# Patient Record
Sex: Female | Born: 1955 | Race: White | Hispanic: No | Smoking: Former smoker
Health system: Southern US, Community
[De-identification: ages and names within clinical notes are randomized; demographics above are authoritative.]

## PROBLEM LIST (undated history)

## (undated) DIAGNOSIS — I10 Essential (primary) hypertension: Secondary | ICD-10-CM

## (undated) HISTORY — PX: HEMORROIDECTOMY: SUR656

## (undated) HISTORY — PX: BLADDER SUSPENSION: SHX72

## (undated) HISTORY — PX: CARPAL TUNNEL RELEASE: SHX101

## (undated) HISTORY — PX: ABDOMINAL HYSTERECTOMY: SHX81

## (undated) HISTORY — PX: COLONOSCOPY: SHX174

## (undated) HISTORY — PX: TUBAL LIGATION: SHX77

---

## 2004-11-29 ENCOUNTER — Ambulatory Visit: Payer: Self-pay | Admitting: Psychiatry

## 2004-11-29 ENCOUNTER — Inpatient Hospital Stay (HOSPITAL_COMMUNITY): Admission: EM | Admit: 2004-11-29 | Discharge: 2004-11-30 | Payer: Self-pay | Admitting: Psychiatry

## 2006-04-04 ENCOUNTER — Ambulatory Visit: Payer: Self-pay | Admitting: Cardiology

## 2014-08-08 ENCOUNTER — Encounter: Payer: Self-pay | Admitting: Internal Medicine

## 2014-08-08 NOTE — Telephone Encounter (Signed)
This encounter was created in error - please disregard.

## 2017-02-16 ENCOUNTER — Encounter (HOSPITAL_COMMUNITY): Payer: Self-pay | Admitting: Emergency Medicine

## 2017-02-16 ENCOUNTER — Emergency Department (HOSPITAL_COMMUNITY)
Admission: EM | Admit: 2017-02-16 | Discharge: 2017-02-16 | Disposition: A | Discharge status: Home / Self Care | Payer: Self-pay | Attending: Emergency Medicine | Admitting: Emergency Medicine

## 2017-02-16 DIAGNOSIS — F17210 Nicotine dependence, cigarettes, uncomplicated: Secondary | ICD-10-CM | POA: Insufficient documentation

## 2017-02-16 DIAGNOSIS — K047 Periapical abscess without sinus: Secondary | ICD-10-CM | POA: Insufficient documentation

## 2017-02-16 DIAGNOSIS — I10 Essential (primary) hypertension: Secondary | ICD-10-CM | POA: Insufficient documentation

## 2017-02-16 DIAGNOSIS — S025XXB Fracture of tooth (traumatic), initial encounter for open fracture: Secondary | ICD-10-CM

## 2017-02-16 DIAGNOSIS — K0381 Cracked tooth: Secondary | ICD-10-CM | POA: Insufficient documentation

## 2017-02-16 HISTORY — DX: Essential (primary) hypertension: I10

## 2017-02-16 MED ORDER — CLINDAMYCIN HCL 300 MG PO CAPS: 300.0000 mg | ORAL_CAPSULE | Freq: Four times a day (QID) | ORAL | 0 refills | Status: DC

## 2017-02-16 MED ORDER — HYDROCODONE-ACETAMINOPHEN 5-325 MG PO TABS: 1.0000 | ORAL_TABLET | ORAL | 0 refills | Status: DC | PRN

## 2017-02-16 MED ORDER — CLINDAMYCIN HCL 150 MG PO CAPS
300.0000 mg | ORAL_CAPSULE | Freq: Once | ORAL | Status: AC
  Administered 2017-02-16: 300 mg via ORAL
  Filled 2017-02-16: qty 2

## 2017-02-16 MED ORDER — HYDROCODONE-ACETAMINOPHEN 5-325 MG PO TABS
1.0000 | ORAL_TABLET | Freq: Once | ORAL | Status: AC
  Administered 2017-02-16: 1 via ORAL
  Filled 2017-02-16: qty 1

## 2017-02-16 NOTE — ED Triage Notes (Signed)
Patient c/o right upper dental pain that started yesterday. Patient states broke tooth 2 weeks ago. Denies any fevers but reports facial swelling. Patient taking ibuprofen, goody powder, and using clove oil with no relief.

## 2017-02-16 NOTE — ED Notes (Signed)
Dental pain x 2 weeks after her tooth broke now pain and swelling not relieved by OTC meds Dentist [facility]

## 2017-02-16 NOTE — ED Provider Notes (Signed)
[name] DEPT Provider Note   CSN: [id number] Arrival date & time: 02/16/17  1140  By signing my name below, I, [name], attest that this documentation has been prepared under the direction and in the presence of Evalee Jefferson, PA-C. Electronically Signed: [name], Scribe. 02/16/17. 1:39 PM.  History   Chief Complaint Chief Complaint  Patient presents with  . Dental Pain   HPI Comments: Kristy Hall is a 61 y.o. female with a hx of hypertension, who presents to the Emergency Department complaining of  Gradual onset, constant right upper dental pain that began yesterday. Patient states she bit into something and her tooth broke off 2 weeks ago. Patient reports associated facial swelling. Patient reports taking aleve, goody powder, and using clove oil with no relief in pain. Patient describes her pain as throbbing with a severity of 10/10. Patient states the pain is worse with cold sensation, better with warm compresses. Patient does have a primary care dentist (Dr. Nori Riis). Patient denies any medication allergies. Patient denies any fever, headache, or any other symptoms.   The history is provided by the patient. No language interpreter was used.    Past Medical History:  Diagnosis Date  . Hypertension     There are no active problems to display for this patient.   Past Surgical History:  Procedure Laterality Date  . ABDOMINAL HYSTERECTOMY    . BLADDER SUSPENSION    . CARPAL TUNNEL RELEASE Bilateral   . HEMORROIDECTOMY      OB History    Gravida Para Term Preterm AB Living   3 3 3     3    SAB TAB Ectopic Multiple Live Births                   Home Medications    Prior to Admission medications   Medication Sig Start Date End Date Taking? Authorizing Provider  clindamycin (CLEOCIN) 300 MG capsule Take 1 capsule (300 mg total) by mouth every 6 (six) hours. 02/16/17   Evalee Jefferson, PA-C  HYDROcodone-acetaminophen (NORCO/VICODIN) 5-325 MG tablet Take 1 tablet by  mouth every 4 (four) hours as needed. 02/16/17   Evalee Jefferson, PA-C    Family History History reviewed. No pertinent family history.  Social History Social History  Substance Use Topics  . Smoking status: Current Every Day Smoker    Packs/day: 1.00    Years: 15.00    Types: Cigarettes  . Smokeless tobacco: Never Used  . Alcohol use Yes     Comment: occasional     Allergies   Patient has no known allergies.   Review of Systems Review of Systems  Constitutional: Negative for fever.  HENT: Positive for dental problem and facial swelling. Negative for sore throat.   Respiratory: Negative for shortness of breath.   Musculoskeletal: Negative for neck pain and neck stiffness.  Neurological: Negative for headaches.     Physical Exam Updated Vital Signs BP 146/84   Pulse 78   Temp 98.2 F (36.8 C) (Oral)   Resp 18   Ht 5\' 1"  (1.549 m)   Wt 61.2 kg   SpO2 98%   BMI 25.51 kg/m   Physical Exam  Constitutional: She is oriented to person, place, and time. She appears well-developed and well-nourished. No distress.  HENT:  Head: Normocephalic and atraumatic.  Right Ear: Tympanic membrane and external ear normal.  Left Ear: Tympanic membrane and external ear normal.  Mouth/Throat: Oropharynx is clear and moist and mucous membranes are normal.  No oral lesions. No trismus in the jaw. Dental abscesses present.  Right upper lateral incisor is fractured to the gingiva line with indurated buccal mucosa. There is a mild early induration to her right nasal fold without facial erythema. No drainage. No fluctuance.  Bilateral TMs are clear. External canals are patent. Sublingual space is soft.   Eyes: Conjunctivae and EOM are normal. Pupils are equal, round, and reactive to light.  Neck: Normal range of motion. Neck supple.  Cardiovascular: Normal rate and normal heart sounds.   Pulmonary/Chest: Effort normal.  Abdominal: She exhibits no distension.  Musculoskeletal: Normal range of  motion.  Lymphadenopathy:    She has no cervical adenopathy.  Neurological: She is alert and oriented to person, place, and time.  Skin: Skin is warm and dry. No erythema.  Psychiatric: She has a normal mood and affect.     ED Treatments / Results  DIAGNOSTIC STUDIES: Oxygen Saturation is 98% on RA, normal by my interpretation.    COORDINATION OF CARE: 1:39 PM Discussed treatment plan with pt at bedside and pt agreed to plan, which includes pain medication and antibiotics.   Labs (all labs ordered are listed, but only abnormal results are displayed) Labs Reviewed - No data to display  EKG  EKG Interpretation None       Radiology No results found.  Procedures Procedures (including critical care time)  Medications Ordered in ED Medications  clindamycin (CLEOCIN) capsule 300 mg (300 mg Oral Given 02/16/17 1402)  HYDROcodone-acetaminophen (NORCO/VICODIN) 5-325 MG per tablet 1 tablet (1 tablet Oral Given 02/16/17 1402)     Initial Impression / Assessment and Plan / ED Course  I have reviewed the triage vital signs and the nursing notes.  Pertinent labs & imaging results that were available during my care of the patient were reviewed by me and considered in my medical decision making (see chart for details).     Localized dental abscess with dental fracture.  Clindamycin given early facial involvement, hydrocodone.  Pt to contact dentist in am for definitive tx.  Advised returning here for any expanding facial pain or swelling.  Final Clinical Impressions(s) / ED Diagnoses   Final diagnoses:  Dental infection  Open fracture of tooth, initial encounter    New Prescriptions Discharge Medication List as of 02/16/2017  2:01 PM    START taking these medications   Details  clindamycin (CLEOCIN) 300 MG capsule Take 1 capsule (300 mg total) by mouth every 6 (six) hours., Starting Sun 02/16/2017, Print    HYDROcodone-acetaminophen (NORCO/VICODIN) 5-325 MG tablet Take 1  tablet by mouth every 4 (four) hours as needed., Starting Sun 02/16/2017, Print       I personally performed the services described in this documentation, which was scribed in my presence. The recorded information has been reviewed and is accurate.     Evalee Jefferson, PA-C 02/17/17 1303    Milton Ferguson, MD 02/17/17 [phone]

## 2017-02-16 NOTE — Discharge Instructions (Signed)
Complete your entire course of antibiotics as prescribed.  You  may use the hydrocodone for pain relief but do not drive within 4 hours of taking as this will make you drowsy.  Avoid applying heat or ice to this abscess area which can worsen your symptoms.  You may use warm salt water swish and spit treatment or half peroxide and water swish and spit after meals to keep this area clean as discussed.  Call your dentist Monday to obtain follow up care.

## 2018-04-24 ENCOUNTER — Other Ambulatory Visit: Payer: Self-pay | Admitting: Internal Medicine

## 2018-04-24 DIAGNOSIS — M545 Low back pain: Secondary | ICD-10-CM

## 2019-11-05 ENCOUNTER — Other Ambulatory Visit: Payer: Self-pay

## 2019-11-05 ENCOUNTER — Encounter (HOSPITAL_COMMUNITY): Payer: Self-pay | Admitting: Cardiology

## 2019-11-05 ENCOUNTER — Inpatient Hospital Stay (HOSPITAL_COMMUNITY)
Admission: RE | Admit: 2019-11-05 | Discharge: 2019-11-06 | DRG: 247 | Disposition: A | Discharge status: Home / Self Care | Payer: Medicare Other | Source: Other Acute Inpatient Hospital | Attending: Cardiology | Admitting: Cardiology

## 2019-11-05 ENCOUNTER — Encounter (HOSPITAL_COMMUNITY): Admission: RE | Disposition: A | Discharge status: Home / Self Care | Payer: Self-pay | Attending: Cardiology

## 2019-11-05 DIAGNOSIS — I200 Unstable angina: Secondary | ICD-10-CM | POA: Diagnosis present

## 2019-11-05 DIAGNOSIS — I2510 Atherosclerotic heart disease of native coronary artery without angina pectoris: Secondary | ICD-10-CM | POA: Diagnosis present

## 2019-11-05 DIAGNOSIS — I214 Non-ST elevation (NSTEMI) myocardial infarction: Principal | ICD-10-CM | POA: Diagnosis present

## 2019-11-05 DIAGNOSIS — F17200 Nicotine dependence, unspecified, uncomplicated: Secondary | ICD-10-CM | POA: Diagnosis not present

## 2019-11-05 DIAGNOSIS — I10 Essential (primary) hypertension: Secondary | ICD-10-CM | POA: Diagnosis present

## 2019-11-05 DIAGNOSIS — I25110 Atherosclerotic heart disease of native coronary artery with unstable angina pectoris: Secondary | ICD-10-CM | POA: Diagnosis not present

## 2019-11-05 DIAGNOSIS — F17210 Nicotine dependence, cigarettes, uncomplicated: Secondary | ICD-10-CM | POA: Diagnosis present

## 2019-11-05 DIAGNOSIS — E782 Mixed hyperlipidemia: Secondary | ICD-10-CM | POA: Diagnosis present

## 2019-11-05 DIAGNOSIS — Z955 Presence of coronary angioplasty implant and graft: Secondary | ICD-10-CM

## 2019-11-05 HISTORY — DX: Mixed hyperlipidemia: E78.2

## 2019-11-05 HISTORY — DX: Nicotine dependence, unspecified, uncomplicated: F17.200

## 2019-11-05 HISTORY — PX: LEFT HEART CATH AND CORONARY ANGIOGRAPHY: CATH118249

## 2019-11-05 HISTORY — PX: CORONARY STENT INTERVENTION: CATH118234

## 2019-11-05 LAB — POCT ACTIVATED CLOTTING TIME: Activated Clotting Time: 290 seconds

## 2019-11-05 SURGERY — LEFT HEART CATH AND CORONARY ANGIOGRAPHY
Anesthesia: LOCAL

## 2019-11-05 MED ORDER — HEPARIN (PORCINE) IN NACL 1000-0.9 UT/500ML-% IV SOLN
INTRAVENOUS | Status: AC
  Filled 2019-11-05: qty 1000

## 2019-11-05 MED ORDER — SODIUM CHLORIDE 0.9 % IV SOLN: 250.0000 mL | INTRAVENOUS | Status: DC | PRN

## 2019-11-05 MED ORDER — SODIUM CHLORIDE 0.9% FLUSH
3.0000 mL | Freq: Two times a day (BID) | INTRAVENOUS | Status: DC
  Administered 2019-11-05: 3 mL via INTRAVENOUS

## 2019-11-05 MED ORDER — SODIUM CHLORIDE 0.9 % IV SOLN
250.0000 mL | INTRAVENOUS | Status: DC | PRN
  Administered 2019-11-05: 500 mL via INTRAVENOUS

## 2019-11-05 MED ORDER — VERAPAMIL HCL 2.5 MG/ML IV SOLN
INTRAVENOUS | Status: DC | PRN
  Administered 2019-11-05: 4 mL via INTRA_ARTERIAL

## 2019-11-05 MED ORDER — SODIUM CHLORIDE 0.9% FLUSH: 3.0000 mL | INTRAVENOUS | Status: DC | PRN

## 2019-11-05 MED ORDER — ASPIRIN EC 81 MG PO TBEC
81.0000 mg | DELAYED_RELEASE_TABLET | Freq: Every day | ORAL | Status: DC
  Administered 2019-11-06: 81 mg via ORAL
  Filled 2019-11-05: qty 1

## 2019-11-05 MED ORDER — NITROGLYCERIN 0.4 MG SL SUBL: 0.4000 mg | SUBLINGUAL_TABLET | SUBLINGUAL | Status: DC | PRN

## 2019-11-05 MED ORDER — MIDAZOLAM HCL 2 MG/2ML IJ SOLN
INTRAMUSCULAR | Status: DC | PRN
  Administered 2019-11-05: 2 mg via INTRAVENOUS

## 2019-11-05 MED ORDER — ACETAMINOPHEN 325 MG PO TABS: 650.0000 mg | ORAL_TABLET | ORAL | Status: DC | PRN

## 2019-11-05 MED ORDER — MIDAZOLAM HCL 2 MG/2ML IJ SOLN
INTRAMUSCULAR | Status: AC
  Filled 2019-11-05: qty 2

## 2019-11-05 MED ORDER — ATORVASTATIN CALCIUM 80 MG PO TABS: 80.0000 mg | ORAL_TABLET | Freq: Every day | ORAL | Status: DC

## 2019-11-05 MED ORDER — METOPROLOL TARTRATE 12.5 MG HALF TABLET
12.5000 mg | ORAL_TABLET | Freq: Two times a day (BID) | ORAL | Status: DC
  Administered 2019-11-05 – 2019-11-06 (×2): 12.5 mg via ORAL
  Filled 2019-11-05 (×2): qty 1

## 2019-11-05 MED ORDER — TICAGRELOR 90 MG PO TABS
90.0000 mg | ORAL_TABLET | Freq: Two times a day (BID) | ORAL | Status: DC
  Administered 2019-11-06: 04:00:00 90 mg via ORAL
  Filled 2019-11-05: qty 1

## 2019-11-05 MED ORDER — SODIUM CHLORIDE 0.9 % WEIGHT BASED INFUSION: 1.0000 mL/kg/h | INTRAVENOUS | Status: DC

## 2019-11-05 MED ORDER — LIDOCAINE HCL (PF) 1 % IJ SOLN
INTRAMUSCULAR | Status: AC
  Filled 2019-11-05: qty 30

## 2019-11-05 MED ORDER — SODIUM CHLORIDE 0.9 % WEIGHT BASED INFUSION: 3.0000 mL/kg/h | INTRAVENOUS | Status: DC

## 2019-11-05 MED ORDER — FENTANYL CITRATE (PF) 100 MCG/2ML IJ SOLN
INTRAMUSCULAR | Status: DC | PRN
  Administered 2019-11-05: 25 ug via INTRAVENOUS

## 2019-11-05 MED ORDER — NITROGLYCERIN 1 MG/10 ML FOR IR/CATH LAB
INTRA_ARTERIAL | Status: AC
  Filled 2019-11-05: qty 10

## 2019-11-05 MED ORDER — FENTANYL CITRATE (PF) 100 MCG/2ML IJ SOLN
INTRAMUSCULAR | Status: AC
  Filled 2019-11-05: qty 2

## 2019-11-05 MED ORDER — TICAGRELOR 90 MG PO TABS
ORAL_TABLET | ORAL | Status: AC
  Filled 2019-11-05: qty 2

## 2019-11-05 MED ORDER — HEPARIN SODIUM (PORCINE) 1000 UNIT/ML IJ SOLN
INTRAMUSCULAR | Status: AC
  Filled 2019-11-05: qty 1

## 2019-11-05 MED ORDER — LIDOCAINE HCL (PF) 1 % IJ SOLN
INTRAMUSCULAR | Status: DC | PRN
  Administered 2019-11-05: 2 mL via SUBCUTANEOUS

## 2019-11-05 MED ORDER — TICAGRELOR 90 MG PO TABS
ORAL_TABLET | ORAL | Status: DC | PRN
  Administered 2019-11-05: 180 mg via ORAL

## 2019-11-05 MED ORDER — ASPIRIN 81 MG PO CHEW: 81.0000 mg | CHEWABLE_TABLET | ORAL | Status: DC

## 2019-11-05 MED ORDER — ONDANSETRON HCL 4 MG/2ML IJ SOLN: 4.0000 mg | Freq: Four times a day (QID) | INTRAMUSCULAR | Status: DC | PRN

## 2019-11-05 MED ORDER — SODIUM CHLORIDE 0.9% FLUSH
3.0000 mL | Freq: Two times a day (BID) | INTRAVENOUS | Status: DC
  Administered 2019-11-06: 3 mL via INTRAVENOUS

## 2019-11-05 MED ORDER — VERAPAMIL HCL 2.5 MG/ML IV SOLN
INTRAVENOUS | Status: AC
  Filled 2019-11-05: qty 2

## 2019-11-05 MED ORDER — SODIUM CHLORIDE 0.9 % WEIGHT BASED INFUSION
1.0000 mL/kg/h | INTRAVENOUS | Status: AC
  Administered 2019-11-05: 1 mL/kg/h via INTRAVENOUS

## 2019-11-05 MED ORDER — HEPARIN SODIUM (PORCINE) 1000 UNIT/ML IJ SOLN
INTRAMUSCULAR | Status: DC | PRN
  Administered 2019-11-05: 4000 [IU] via INTRAVENOUS
  Administered 2019-11-05: 3000 [IU] via INTRAVENOUS

## 2019-11-05 MED ORDER — HEPARIN (PORCINE) IN NACL 1000-0.9 UT/500ML-% IV SOLN
INTRAVENOUS | Status: DC | PRN
  Administered 2019-11-05 (×2): 500 mL

## 2019-11-05 MED ORDER — IOHEXOL 350 MG/ML SOLN
INTRAVENOUS | Status: DC | PRN
  Administered 2019-11-05: 100 mL via INTRA_ARTERIAL

## 2019-11-05 MED ORDER — NITROGLYCERIN 1 MG/10 ML FOR IR/CATH LAB
INTRA_ARTERIAL | Status: DC | PRN
  Administered 2019-11-05 (×2): 200 ug via INTRACORONARY

## 2019-11-05 SURGICAL SUPPLY — 15 items
BALLN EMERGE MR 2.5X15 (BALLOONS) ×2
BALLOON EMERGE MR 2.5X15 (BALLOONS) IMPLANT
CATH LAUNCHER 6FR EBU3.5 (CATHETERS) ×1 IMPLANT
CATH OPTITORQUE TIG 4.0 5F (CATHETERS) ×1 IMPLANT
DEVICE RAD COMP TR BAND LRG (VASCULAR PRODUCTS) ×1 IMPLANT
GLIDESHEATH SLEND SS 6F .021 (SHEATH) ×1 IMPLANT
GUIDEWIRE INQWIRE 1.5J.035X260 (WIRE) IMPLANT
INQWIRE 1.5J .035X260CM (WIRE) ×4
KIT ENCORE 26 ADVANTAGE (KITS) ×1 IMPLANT
KIT HEART LEFT (KITS) ×2 IMPLANT
PACK CARDIAC CATHETERIZATION (CUSTOM PROCEDURE TRAY) ×2 IMPLANT
STENT RESOLUTE ONYX 2.5X18 (Permanent Stent) ×1 IMPLANT
TRANSDUCER W/STOPCOCK (MISCELLANEOUS) ×2 IMPLANT
TUBING CIL FLEX 10 FLL-RA (TUBING) ×2 IMPLANT
WIRE COUGAR XT STRL 190CM (WIRE) ×1 IMPLANT

## 2019-11-05 NOTE — H&P (Addendum)
CARDIOLOGY ADMIT NOTE   Kristy Hall is an 63 y.o. female.    CC: Chest pain     HPI: Kristy Hall  is a 63 y.o. Caucasian female with hypertension, tobacco use disorder, has at least a 30-40-pack-year history of smoking, recently also told to have mild hyperlipidemia, not on therapy, admitted to [facility] with chest pain ongoing for the past 3 to 4 days.  Chest pain described as tightness to heaviness in the middle of the chest and also radiating to the right arm, was on and off for the past 4 days.  Eventually pain got worse, presented to the ED, although EKG was normal, with her rest pain and mildly abnormal serum troponin, she was evaluated in the hospital, was recommended cardiac catheterization by cardiologist who was evaluating at [facility] hospital.  Patient did not want to go to [facility], requested that she be transferred to [facility].  States that since being on IV medications, chest pain symptoms are resolved.  She is presently doing well.  Past Medical History:  Diagnosis Date  . Hypertension   . Mixed hyperlipidemia 11/05/2019  . Tobacco use disorder 11/05/2019    Past Surgical History:  Procedure Laterality Date  . ABDOMINAL HYSTERECTOMY    . BLADDER SUSPENSION    . CARPAL TUNNEL RELEASE Bilateral   . HEMORROIDECTOMY      Social History   Socioeconomic History  . Marital status: Married    Spouse name: Not on file  . Number of children: Not on file  . Years of education: Not on file  . Highest education level: Not on file  Occupational History  . Not on file  Social Needs  . Financial resource strain: Not on file  . Food insecurity    Worry: Not on file    Inability: Not on file  . Transportation needs    Medical: Not on file    Non-medical: Not on file  Tobacco Use  . Smoking status: Current Every Day Smoker    Packs/day: 1.00    Years: 15.00    Pack years: 15.00    Types: Cigarettes  . Smokeless tobacco: Never Used   Substance and Sexual Activity  . Alcohol use: Yes    Comment: occasional  . Drug use: No  . Sexual activity: Not on file  Lifestyle  . Physical activity    Days per week: Not on file    Minutes per session: Not on file  . Stress: Not on file  Relationships  . Social Herbalist on phone: Not on file    Gets together: Not on file    Attends religious service: Not on file    Active member of club or organization: Not on file    Attends meetings of clubs or organizations: Not on file    Relationship status: Not on file  . Intimate partner violence    Fear of current or ex partner: Not on file    Emotionally abused: Not on file    Physically abused: Not on file    Forced sexual activity: Not on file  Other Topics Concern  . Not on file  Social History Narrative  . Not on file    No current facility-administered medications on file prior to encounter.    Current Outpatient Medications on File Prior to Encounter  Medication Sig Dispense Refill  . clindamycin (CLEOCIN) 300 MG capsule Take 1 capsule (300 mg total) by mouth  every 6 (six) hours. 28 capsule 0  . HYDROcodone-acetaminophen (NORCO/VICODIN) 5-325 MG tablet Take 1 tablet by mouth every 4 (four) hours as needed. 15 tablet 0    No intake/output data recorded.  No intake/output data recorded.   Review of Systems  Constitution: Negative for chills, decreased appetite, malaise/fatigue and weight gain.  Cardiovascular: Positive for chest pain and dyspnea on exertion. Negative for leg swelling and syncope.  Endocrine: Negative for cold intolerance.  Hematologic/Lymphatic: Does not bruise/bleed easily.  Musculoskeletal: Negative for joint swelling.  Gastrointestinal: Negative for abdominal pain, anorexia, change in bowel habit, hematochezia and melena.  Neurological: Negative for headaches and light-headedness.  Psychiatric/Behavioral: Negative for depression and substance abuse.  All other systems reviewed and  are negative.   Objective:  There were no vitals taken for this visit. There is no height or weight on file to calculate BMI. Vitals with BMI 02/16/2017 02/16/2017  Height - '5\' 1"'   Weight - 135 lbs  BMI - 88.9  Systolic 169 450  Diastolic 84 96  Pulse 78 96    There were no vitals taken for this visit. There is no height or weight on file to calculate BMI.   Physical Exam  Constitutional:  She is moderately built and well-nourished in no acute distress.  HENT:  Head: Atraumatic.  Eyes: Conjunctivae are normal.  Neck: Neck supple. No JVD present. No thyromegaly present.  Cardiovascular: Normal rate, regular rhythm, normal heart sounds and intact distal pulses. Exam reveals no gallop.  No murmur heard. No leg edema, no JVD.  Pulmonary/Chest: Effort normal and breath sounds normal.  Abdominal: Soft. Bowel sounds are normal.  Musculoskeletal: Normal range of motion.  Neurological: She is alert.  Skin: Skin is warm and dry.  Psychiatric: She has a normal mood and affect.   Radiology: No results found.  Laboratory examination:   Labs 11/05/2019: Sodium 140, potassium 3.3, BUN 17, creatinine 0.56, EGFR greater than 60 mL, CMP otherwise normal.  D-dimer negative at 0.41. HPI 12.9/HCT 39.0, platelets 267.  Normal indicis. Serum troponins serial: 0.04, 0.03, 0.06, 0.05, abnormal, normal <0.01.  Total cholesterol 246, triglycerides 199, HDL 54, LDL 152.  No results for input(s): NA, K, CL, CO2, GLUCOSE, BUN, CREATININE, CALCIUM, GFRNONAA, GFRAA in the last 8760 hours. No flowsheet data found. No flowsheet data found. Lipid Panel  No results found for: CHOL, TRIG, HDL, CHOLHDL, VLDL, LDLCALC, LDLDIRECT HEMOGLOBIN A1C No results found for: HGBA1C, MPG TSH No results for input(s): TSH in the last 8760 hours. Medications   Prior to Admission medications   Medication Sig Start Date End Date Taking? Authorizing Provider  clindamycin (CLEOCIN) 300 MG capsule Take 1 capsule (300 mg  total) by mouth every 6 (six) hours. 02/16/17   Evalee Jefferson, PA-C  HYDROcodone-acetaminophen (NORCO/VICODIN) 5-325 MG tablet Take 1 tablet by mouth every 4 (four) hours as needed. 02/16/17   Evalee Jefferson, PA-C     Current Outpatient Medications  Medication Instructions  . clindamycin (CLEOCIN) 300 mg, Oral, Every 6 hours  . HYDROcodone-acetaminophen (NORCO/VICODIN) 5-325 MG tablet 1 tablet, Oral, Every 4 hours PRN    Cardiac Studies:   EKG 11/05/2019: Normal sinus rhythm at rate of 75 bpm, normal axis, no evidence of ischemia, normal EKG.  Chest x-ray PA and lateral view 10/22/2019: Heart and mediastinal contours are within normal limits.  Mildly calcified aortic knob.  Mild linear bibasilar atelectasis.  Assessment  1.  Unstable angina pectoris 2.  Essential hypertension 3.  Mixed hyperlipidemia 4.  Tobacco use disorder  Recommendations:   Patient presenting with ongoing chest pain, which is improved with nitroglycerin and also on IV heparin has not had any recurrence.  Although the troponins are flat, no EKG abnormality, in view of ongoing chest pain and her risk factors including uncontrolled and untreated hyperlipidemia, tobacco use disorder, will proceed with cardiac catheterization.  Patient was already started on Brilinta and aspirin by local cardiologist who did the consult earlier today in [facility].  [facility] also resume antihypertensive medications and also discussed regarding smoking cessation.  Further recommendations to follow.  Patient already has established atherosclerosis as evidenced by calcification of the aortic knob on chest x-ray.  Adrian Prows, MD, [facility] 11/05/2019, 4:49 PM [facility] [place] Pager: [phone] Office: [phone] If no answer Cell [phone]

## 2019-11-05 NOTE — Progress Notes (Signed)
Patient called out at 1900 stating she thought wrist was bleeding. No bleeding noted on assessment, but hematoma above band present. Pressure held; bedside report given to [facility] who will continue to monitor closely.

## 2019-11-05 NOTE — Interval H&P Note (Signed)
History and Physical Interval Note:  11/05/2019 4:56 PM  Kristy Hall  has presented today for surgery, with the diagnosis of Chest pain.  The various methods of treatment have been discussed with the patient and family. After consideration of risks, benefits and other options for treatment, the patient has consented to  Procedure(s): LEFT HEART CATH AND CORONARY ANGIOGRAPHY (N/A) and possible angioplasty as a surgical intervention.  The patient's history has been reviewed, patient examined, no change in status, stable for surgery.  I have reviewed the patient's chart and labs.  Questions were answered to the patient's satisfaction.   Cath Lab Visit (complete for each Cath Lab visit)  Clinical Evaluation Leading to the Procedure:   ACS: Yes.    Non-ACS:    Anginal Classification: CCS IV  Anti-ischemic medical therapy: No Therapy  Non-Invasive Test Results: No non-invasive testing performed  Prior CABG: No previous CABG        Adrian Prows

## 2019-11-06 ENCOUNTER — Encounter (HOSPITAL_COMMUNITY): Payer: Self-pay | Admitting: *Deleted

## 2019-11-06 ENCOUNTER — Other Ambulatory Visit: Payer: Self-pay

## 2019-11-06 LAB — CBC
HCT: 37.8 % (ref 36.0–46.0)
Hemoglobin: 12.6 g/dL (ref 12.0–15.0)
MCH: 29.8 pg (ref 26.0–34.0)
MCHC: 33.3 g/dL (ref 30.0–36.0)
MCV: 89.4 fL (ref 80.0–100.0)
Platelets: 230 10*3/uL (ref 150–400)
RBC: 4.23 MIL/uL (ref 3.87–5.11)
RDW: 13.1 % (ref 11.5–15.5)
WBC: 10.2 10*3/uL (ref 4.0–10.5)
nRBC: 0 % (ref 0.0–0.2)

## 2019-11-06 LAB — BASIC METABOLIC PANEL
Anion gap: 10 (ref 5–15)
BUN: 12 mg/dL (ref 8–23)
CO2: 23 mmol/L (ref 22–32)
Calcium: 9.1 mg/dL (ref 8.9–10.3)
Chloride: 107 mmol/L (ref 98–111)
Creatinine, Ser: 0.79 mg/dL (ref 0.44–1.00)
GFR calc Af Amer: >60 mL/min (ref >60)
GFR calc non Af Amer: >60 mL/min (ref >60)
Glucose, Bld: 94 mg/dL (ref 70–99)
Potassium: 3.8 mmol/L (ref 3.5–5.1)
Sodium: 140 mmol/L (ref 135–145)

## 2019-11-06 LAB — HIV ANTIBODY (ROUTINE TESTING W REFLEX): HIV Screen 4th Generation wRfx: NONREACTIVE

## 2019-11-06 MED ORDER — ANGIOPLASTY BOOK
Freq: Once | Status: AC
  Administered 2019-11-06: 04:00:00
  Filled 2019-11-06: qty 1

## 2019-11-06 MED ORDER — ASPIRIN 81 MG PO TBEC: 81.0000 mg | DELAYED_RELEASE_TABLET | Freq: Every day | ORAL | 2 refills | Status: DC

## 2019-11-06 MED ORDER — ACETAMINOPHEN 325 MG PO TABS: 650.0000 mg | ORAL_TABLET | ORAL | Status: DC | PRN

## 2019-11-06 MED ORDER — METOPROLOL SUCCINATE ER 25 MG PO TB24: 25.0000 mg | ORAL_TABLET | Freq: Every day | ORAL | Status: DC

## 2019-11-06 MED ORDER — METOPROLOL SUCCINATE ER 25 MG PO TB24: 25.0000 mg | ORAL_TABLET | Freq: Every day | ORAL | 1 refills | Status: DC

## 2019-11-06 MED ORDER — TICAGRELOR 90 MG PO TABS: 90.0000 mg | ORAL_TABLET | Freq: Two times a day (BID) | ORAL | 11 refills | Status: DC

## 2019-11-06 MED ORDER — NICOTINE 14 MG/24HR TD PT24: 14.0000 mg | MEDICATED_PATCH | TRANSDERMAL | 0 refills | Status: DC

## 2019-11-06 MED ORDER — ATORVASTATIN CALCIUM 80 MG PO TABS: 80.0000 mg | ORAL_TABLET | Freq: Every day | ORAL | 1 refills | Status: DC

## 2019-11-06 MED ORDER — NITROGLYCERIN 0.4 MG SL SUBL: 0.4000 mg | SUBLINGUAL_TABLET | SUBLINGUAL | 3 refills | Status: DC | PRN

## 2019-11-06 NOTE — Progress Notes (Signed)
Nurse provided pt with a one month free card for Brilinta. [place] at [phone], spoke with [facility] and they will accept the Brilinta card. Met with pt and she is concern and that she won't be able to afford her co-payment of $400 for the brilinta. Informed pt that Suzie Portela is going to accept the Brilinta one month free card and to f/u with her physician and discuss her concerns about her co-payment. Discussed with pt the importance of taking the medication and informing her doctor about her inability to afford this medication. Discussed with RN pt's concern.

## 2019-11-06 NOTE — Progress Notes (Signed)
CARDIAC REHAB PHASE I   PRE:  Rate/Rhythm: 77 SR    BP: sitting 142/72    SaO2:   MODE:  Ambulation: 400 ft   POST:  Rate/Rhythm: 89 SR    BP: sitting 139/76     SaO2:    tolerated well, feels good. Ed completed including Brilinta, stent, restrictions, smoking cessation, diet, exercise, NTG and CRPII. Will refer to [place]. Pt is motivated to quit smoking. She understands Brilinta importance. [place], ACSM 11/06/2019 9:54 AM

## 2019-11-06 NOTE — Progress Notes (Signed)
TR BAND REMOVAL  LOCATION:    right radial  DEFLATED PER PROTOCOL:    Yes.    TIME BAND OFF / DRESSING APPLIED:    2150   SITE UPON ARRIVAL:    Level 1(small hematoma resolved)  SITE AFTER BAND REMOVAL:    Level 1(bruise)  CIRCULATION SENSATION AND MOVEMENT:    Within Normal Limits   Yes.    COMMENTS:   Pt/tolerated well.No bleeding noted ,with good capillary refill.

## 2019-11-06 NOTE — Discharge Summary (Signed)
Physician Discharge Summary  Patient ID: Kristy Hall MRN: [id number] DOB/AGE: Mar 28, 1956 63 y.o.  Admit date: 11/05/2019 Discharge date: 11/06/2019  Primary Discharge Diagnosis NSTEMI CAD with unstable angina  Secondary Discharge Diagnosis Hypertension Mixed hyperlipidemia Tobacco use disorder  Significant Diagnostic Studies: Left heart catheterization 11/05/2019: LV: Normal LV systolic function without wall motion abnormality.  Normal LVEDP. RCA: Dominant.  Proximal 40% stenosis.  Mid to distal 20% stenosis. Left main: Normal. LAD: Large caliber vessel, diffusely diseased mild to moderately.  No high-grade stenosis evident.  Gives origin to a moderate sized D1 and several small D s. Ramus intermediate: Small sized vessel, tortuous, mild diffuse disease. Circumflex: Large area of distribution, gives origin to moderate sized OM1, proximal circumflex has a 95% ulcerated stenosis.  SP 2.5 x 18 mm resolute Onyx DES reduced to 0% with TIMI-3 to TIMI-3 flow.  EKG 11/06/2019: Normal sinus rhythm with rate of 62 bpm, normal axis.  No evidence of ischemia, normal EKG.  Recommendations: Patient be discharged home if she remains stable tomorrow otherwise in 48 hours.  She will need dual antiplatelet therapy for a year.  High-dose statins will be utilized.  Smoking cessation   Hospital Course: Transferred from [facility] with non-STEMI has diagnosis with elevated serum troponin and mildly abnormal EKG with ongoing chest pain.  Underwent urgent cardiac catheterization same day as hospital admission and was found to have high-grade single-vessel disease and moderate disease in other vessels, underwent successful angioplasty.  She was admitted for further observation, however the following morning she was asymptomatic and walking and ambulating in the hallway without any chest pain or shortness of breath.  Telemetry did not reveal any significant arrhythmias.  Felt stable for  discharge.  Recommendations on discharge: Patient will continue dual antiplatelet therapy with aspirin and Brilinta for at least 1 year.  High-dose statin with Lipitor 80 mg have been started which she is tolerating so far.  I have discontinued amlodipine and switch her to metoprolol succinate 25 mg daily.  I will see her back in the office in 1 week to 10 days.  Smoking cessation was discussed with the patient at length, she appears very motivated.  I have also sent in for nicotine patches.  Discharge Exam: Blood pressure 134/76, pulse 62, temperature 98.2 F (36.8 C), temperature source Oral, resp. rate 16, height 5' (1.524 m), weight 60.8 kg, SpO2 97 %.   Physical Exam  Constitutional:  She is moderately built and well-nourished in no acute distress.  Appears older than stated age.  HENT:  Head: Atraumatic.  Eyes: Conjunctivae are normal.  Neck: Neck supple. No JVD present. No thyromegaly present.  Cardiovascular: Normal rate, regular rhythm, normal heart sounds, intact distal pulses and normal pulses. Exam reveals no gallop.  No murmur heard. No leg edema, no JVD. Right radial site without any complications.  Pulmonary/Chest: Effort normal and breath sounds normal.  Abdominal: Soft. Bowel sounds are normal.  Musculoskeletal: Normal range of motion.  Neurological: She is alert.  Skin: Skin is warm and dry.  Psychiatric: She has a normal mood and affect.    Labs:   Labs 11/05/2019: Sodium 140, potassium 3.3, BUN 17, creatinine 0.56, EGFR greater than 60 mL, CMP otherwise normal.  D-dimer negative at 0.41. HPI 12.9/HCT 39.0, platelets 267.  Normal indicis. Serum troponins serial: 0.04, 0.03, 0.06, 0.05, abnormal, normal <0.01.  Total cholesterol 246, triglycerides 199, HDL 54, LDL 152.  Lab Results  Component Value Date   WBC 10.2 11/06/2019  HGB 12.6 11/06/2019   HCT 37.8 11/06/2019   MCV 89.4 11/06/2019   PLT 230 11/06/2019    Recent Labs  Lab 11/06/19 0430  NA 140   K 3.8  CL 107  CO2 23  BUN 12  CREATININE 0.79  CALCIUM 9.1  GLUCOSE 94   Radiology: No results found. FOLLOW UP PLANS AND APPOINTMENTS Discharge Instructions    Amb Referral to Cardiac Rehabilitation   Complete by: As directed    Diagnosis:  Coronary Stents PTCA     After initial evaluation and assessments completed: Virtual Based Care may be provided alone or in conjunction with Phase 2 Cardiac Rehab based on patient barriers.: Yes     Allergies as of 11/06/2019   No Known Allergies     Medication List    TAKE these medications   acetaminophen 325 MG tablet Commonly known as: TYLENOL Take 2 tablets (650 mg total) by mouth every 4 (four) hours as needed for headache or mild pain.   aspirin 81 MG EC tablet Take 1 tablet (81 mg total) by mouth daily.   atorvastatin 80 MG tablet Commonly known as: LIPITOR Take 1 tablet (80 mg total) by mouth daily at 6 PM.   metoprolol succinate 25 MG 24 hr tablet Commonly known as: TOPROL-XL Take 1 tablet (25 mg total) by mouth daily.   nicotine 14 mg/24hr patch Commonly known as: NICODERM CQ - dosed in mg/24 hours Place 1 patch (14 mg total) onto the skin daily.   nitroGLYCERIN 0.4 MG SL tablet Commonly known as: NITROSTAT Place 1 tablet (0.4 mg total) under the tongue every 5 (five) minutes x 3 doses as needed for chest pain.   ticagrelor 90 MG Tabs tablet Commonly known as: BRILINTA Take 1 tablet (90 mg total) by mouth 2 (two) times daily.      Follow-up Information    Adrian Prows, MD. Call.   Specialty: Cardiology Why: To be seen in 7-10 Days. We will also call you Contact information: [place] [address] [phone]          Adrian Prows, MD 11/06/2019, 11:28 AM  Pager: [phone] Office: [phone] If no answer: [phone]

## 2019-11-08 ENCOUNTER — Encounter (HOSPITAL_COMMUNITY): Payer: Self-pay | Admitting: Cardiology

## 2019-11-09 ENCOUNTER — Telehealth: Payer: Self-pay

## 2019-11-09 NOTE — Telephone Encounter (Signed)
-----   Message from Adrian Prows, MD sent at 11/06/2019 10:28 AM EST ----- Regarding: TOC and OV in 1 week

## 2019-11-09 NOTE — Telephone Encounter (Signed)
LVM for pt to call back.

## 2019-11-11 ENCOUNTER — Telehealth: Payer: Self-pay

## 2019-11-11 NOTE — Telephone Encounter (Signed)
Pt wants to know if she can take a extra dose of ASA when she is having chest discomfort she isnt comfortable with taking Nitro

## 2019-11-11 NOTE — Telephone Encounter (Signed)
Sure

## 2019-11-12 NOTE — Telephone Encounter (Signed)
-----   Message from Adrian Prows, MD sent at 11/06/2019 10:28 AM EST ----- Regarding: TOC and OV in 1 week

## 2019-11-12 NOTE — Telephone Encounter (Signed)
error 

## 2019-11-22 ENCOUNTER — Telehealth: Payer: Self-pay

## 2019-11-22 NOTE — Telephone Encounter (Signed)
She was seen in the ED recently and all the tests seem negative so is reassuring, try tylenol, may be she is developing pericarditis after recent MI

## 2019-11-22 NOTE — Telephone Encounter (Signed)
Pt had LHC on 11/13. Called office still c/o of cp. Has not worsened since hospital but still ongoing. Moved appt up from 12/7 to 12/3 this thurs but in the meantime, pt wanted to know if you had any recommendations for the cp until she is seen.//ah

## 2019-11-24 NOTE — Telephone Encounter (Signed)
LMOM to return call. LM attempted to reach pt on 12/1 as well but no answer.//ah

## 2019-11-25 ENCOUNTER — Ambulatory Visit: Payer: Self-pay | Admitting: Cardiology

## 2019-11-25 NOTE — Telephone Encounter (Signed)
Pt aware. Appt cx'd today due to pt having cold symptoms and being scheduled for covid testing today. She will keep [place] updated as far as results.//ah

## 2019-11-29 ENCOUNTER — Ambulatory Visit: Payer: Self-pay | Admitting: Cardiology

## 2019-12-03 ENCOUNTER — Telehealth: Payer: Self-pay

## 2019-12-03 ENCOUNTER — Encounter: Payer: Self-pay | Admitting: Cardiology

## 2019-12-03 ENCOUNTER — Other Ambulatory Visit: Payer: Self-pay

## 2019-12-03 ENCOUNTER — Ambulatory Visit (INDEPENDENT_AMBULATORY_CARE_PROVIDER_SITE_OTHER): Payer: Medicare Other | Admitting: Cardiology

## 2019-12-03 VITALS — BP 136/76 | HR 73 | Temp 97.3°F | Resp 16 | Ht 60.0 in | Wt 135.0 lb

## 2019-12-03 DIAGNOSIS — E782 Mixed hyperlipidemia: Secondary | ICD-10-CM

## 2019-12-03 DIAGNOSIS — F17210 Nicotine dependence, cigarettes, uncomplicated: Secondary | ICD-10-CM | POA: Diagnosis not present

## 2019-12-03 DIAGNOSIS — F17200 Nicotine dependence, unspecified, uncomplicated: Secondary | ICD-10-CM

## 2019-12-03 DIAGNOSIS — I25118 Atherosclerotic heart disease of native coronary artery with other forms of angina pectoris: Secondary | ICD-10-CM | POA: Diagnosis not present

## 2019-12-03 DIAGNOSIS — R0989 Other specified symptoms and signs involving the circulatory and respiratory systems: Secondary | ICD-10-CM

## 2019-12-03 NOTE — Progress Notes (Signed)
Primary Physician/Referring:  Monico Blitz, MD  Patient ID: Kristy Hall, female    DOB: 1956-08-11, 63 y.o.   MRN: [id number]  Chief Complaint  Patient presents with  . Coronary Artery Disease  . Transitions Of Care    CAD   HPI:    Kristy Hall  is a 63 y.o. Caucasian female with hypertension, mixed hyperlipidemia, tobacco use disorder, has at least a 30-40-pack-year history of smoking, was admitted with non-ST relation MI on 11/05/2019, underwent coronary angiography and and stenting to the circumflex coronary artery, discharged home the following day.  She presented to [facility] in [place] with chest pain on 11/21/2019, cardiac markers were negative, was discharged home.  She now presents here for [facility] visit.  Chest pain clearly appears to be musculoskeletal.  She has not had any further chest pains, unfortunately has started to smoke again.  Past Medical History:  Diagnosis Date  . Hypertension   . Mixed hyperlipidemia 11/05/2019  . Tobacco use disorder 11/05/2019   Past Surgical History:  Procedure Laterality Date  . ABDOMINAL HYSTERECTOMY    . BLADDER SUSPENSION    . CARPAL TUNNEL RELEASE Bilateral   . CORONARY STENT INTERVENTION N/A 11/05/2019   Procedure: CORONARY STENT INTERVENTION;  Surgeon: Adrian Prows, MD;  Location: [place] [facility];  Service: Cardiovascular;  Laterality: N/A;  . HEMORROIDECTOMY    . LEFT HEART CATH AND CORONARY ANGIOGRAPHY N/A 11/05/2019   Procedure: LEFT HEART CATH AND CORONARY ANGIOGRAPHY;  Surgeon: Adrian Prows, MD;  Location: [place] [facility];  Service: Cardiovascular;  Laterality: N/A;   Social History   Socioeconomic History  . Marital status: Widowed    Spouse name: Not on file  . Number of children: 3  . Years of education: Not on file  . Highest education level: Not on file  Occupational History  . Not on file  Tobacco Use  . Smoking status: Current Every Day Smoker    Packs/day: 0.50    Years: 15.00   Pack years: 7.50    Types: Cigarettes  . Smokeless tobacco: Never Used  Substance and Sexual Activity  . Alcohol use: Yes    Comment: occasional  . Drug use: No  . Sexual activity: Not on file  Other Topics Concern  . Not on file  Social History Narrative  . Not on file   Social Determinants of Health   Financial Resource Strain:   . Difficulty of Paying Living Expenses: Not on file  Food Insecurity:   . Worried About Charity fundraiser in the Last Year: Not on file  . Ran Out of Food in the Last Year: Not on file  Transportation Needs:   . Lack of Transportation (Medical): Not on file  . Lack of Transportation (Non-Medical): Not on file  Physical Activity:   . Days of Exercise per Week: Not on file  . Minutes of Exercise per Session: Not on file  Stress:   . Feeling of Stress : Not on file  Social Connections:   . Frequency of Communication with Friends and Family: Not on file  . Frequency of Social Gatherings with Friends and Family: Not on file  . Attends Religious Services: Not on file  . Active Member of Clubs or Organizations: Not on file  . Attends Archivist Meetings: Not on file  . Marital Status: Not on file  Intimate Partner Violence:   . Fear of Current or Ex-Partner: Not on file  .  Emotionally Abused: Not on file  . Physically Abused: Not on file  . Sexually Abused: Not on file   ROS  Review of Systems  Constitution: Negative for chills, decreased appetite, malaise/fatigue and weight gain.  Cardiovascular: Negative for dyspnea on exertion, leg swelling and syncope.  Endocrine: Negative for cold intolerance.  Hematologic/Lymphatic: Does not bruise/bleed easily.  Musculoskeletal: Negative for joint swelling.  Gastrointestinal: Negative for abdominal pain, anorexia, change in bowel habit, hematochezia and melena.  Neurological: Negative for headaches and light-headedness.  Psychiatric/Behavioral: Negative for depression and substance abuse.  All  other systems reviewed and are negative.  Objective  Blood pressure 136/76, pulse 73, temperature (!) 97.3 F (36.3 C), resp. rate 16, height 5' (1.524 m), weight 135 lb (61.2 kg), SpO2 97 %.  Vitals with BMI 12/03/2019 11/06/2019 11/06/2019  Height '5\' 0"'  - -  Weight 135 lbs 134 lbs 1 oz -  BMI 24.26 83.41 -  Systolic 962 229 798  Diastolic 76 76 65  Pulse 73 62 -      Physical Exam  Constitutional:  Moderately built and well-nourished, appears older than stated age, in no acute distress.  HENT:  Head: Atraumatic.  Eyes: Conjunctivae are normal.  Neck: No JVD present. No thyromegaly present.  Cardiovascular: Normal rate, regular rhythm, normal heart sounds and intact distal pulses. Exam reveals no gallop.  No murmur heard. Pulses:      Carotid pulses are on the right side with bruit and on the left side with bruit. No leg edema, no JVD.  Pulmonary/Chest: Effort normal and breath sounds normal.  Abdominal: Soft. Bowel sounds are normal.  Musculoskeletal:        General: Normal range of motion.     Cervical back: Neck supple.  Neurological: She is alert.  Skin: Skin is warm and dry.  Psychiatric: She has a normal mood and affect.   Laboratory examination:   Recent Labs    11/06/19 0430  NA 140  K 3.8  CL 107  CO2 23  GLUCOSE 94  BUN 12  CREATININE 0.79  CALCIUM 9.1  GFRNONAA >60  GFRAA >60   CrCl cannot be calculated (Patient's most recent lab result is older than the maximum 21 days allowed.).  CMP Latest Ref Rng & Units 11/06/2019  Glucose 70 - 99 mg/dL 94  BUN 8 - 23 mg/dL 12  Creatinine 0.44 - 1.00 mg/dL 0.79  Sodium 135 - 145 mmol/L 140  Potassium 3.5 - 5.1 mmol/L 3.8  Chloride 98 - 111 mmol/L 107  CO2 22 - 32 mmol/L 23  Calcium 8.9 - 10.3 mg/dL 9.1   CBC Latest Ref Rng & Units 11/06/2019  WBC 4.0 - 10.5 K/uL 10.2  Hemoglobin 12.0 - 15.0 g/dL 12.6  Hematocrit 36.0 - 46.0 % 37.8  Platelets 150 - 400 K/uL 230   Lipid Panel    Labs 11/05/2019:  Sodium 140, potassium 3.3, BUN 17, creatinine 0.56, EGFR greater than 60 mL, CMP otherwise normal. D-dimer negative at 0.41. HPI 12.9/HCT 39.0, platelets 267. Normal indicis. Serum troponins serial: 0.04, 0.03, 0.06, 0.05, abnormal, normal <0.01.  Total cholesterol 246, triglycerides 199, HDL 54, LDL 152. No results found for: CHOL, TRIG, HDL, CHOLHDL, VLDL, LDLCALC, LDLDIRECT HEMOGLOBIN A1C No results found for: HGBA1C, MPG TSH No results for input(s): TSH in the last 8760 hours. Medications and allergies  No Known Allergies   Current Outpatient Medications  Medication Instructions  . acetaminophen (TYLENOL) 650 mg, Oral, Every 4 hours PRN  .  aspirin 81 mg, Oral, Daily  . atorvastatin (LIPITOR) 80 mg, Oral, Daily-1800  . LORazepam (ATIVAN) 0.5 mg, Oral, Daily  . metoprolol succinate (TOPROL-XL) 25 mg, Oral, Daily  . nitroGLYCERIN (NITROSTAT) 0.4 mg, Sublingual, Every 5 min x3 PRN  . ticagrelor (BRILINTA) 90 mg, Oral, 2 times daily    Radiology:  No results found. Cardiac Studies:   Left heart catheterization 11/05/2019: LV: Normal LV systolic function without wall motion abnormality. Normal LVEDP. RCA: Dominant. Proximal 40% stenosis. Mid to distal 20% stenosis. Left main: Normal. LAD: Large caliber vessel, diffusely diseased mild to moderately. No high-grade stenosis evident. Gives origin to a moderate sized D1 and several small D s. Ramus intermediate: Small sized vessel, tortuous, mild diffuse disease. Circumflex: Large area of distribution, gives origin to moderate sized OM1, proximal circumflex has a 95% ulcerated stenosis. SP 2.5 x 18 mm resolute Onyx DES reduced to 0% with TIMI-3 to TIMI-3 flow.  Assessment     ICD-10-CM   1. Coronary artery disease of native artery of native heart with stable angina pectoris ([place])  I25.118 EKG 12-Lead    PCV ECHOCARDIOGRAM COMPLETE  2. Mixed hyperlipidemia  E78.2   3. Tobacco use disorder  F17.200   4. Bilateral carotid  bruits  R09.89 PCV CAROTID DUPLEX (BILATERAL)    EKG 12/03/2019: Normal sinus rhythm at the rate of 67 bpm, normal axis, incomplete right bundle branch block no evidence of ischemia.  Normal EKG. EKG 11/20/2019: Normal sinus rhythm at the rate of 74 bpm, normal axis.  Low voltage complexes otherwise normal.  No significant change from 11/06/2019.  Recommendations:  No orders of the defined types were placed in this encounter.   Caucasian female with hypertension, mixed hyperlipidemia, tobacco use disorder, has at least a 30-40-pack-year history of smoking, was admitted with non-ST relation MI on 11/05/2019, S/P stenting to the circumflex coronary artery, discharged home the following day.  She presented to [facility] in [place] with chest pain on 11/21/2019, cardiac markers were negative, was discharged home.  She now presents here for [facility] visit.  I have discussed with the patient regarding the signs and symptoms of angina pectoris, presentation and when to seek help. Differentiating non anginal pain discussed.  To use NTG as needed. She needs Echocardiogram.   Unfortunately still smoking, advised her that she could lose patches, try to set up a date for quitting.  I had missed bilateral carotid bruit while she was in the hospital, will obtain carotid duplex.  She has been scheduled for blood work by her PCP next month, will add LPA to that, discussed with Dr. Manuella Ghazi.  See her back 3 months for follow-up.  Adrian Prows, MD, [facility] 12/05/2019, 10:45 AM [facility] [place] Pager: [phone] Office: [phone]

## 2019-12-09 ENCOUNTER — Ambulatory Visit (INDEPENDENT_AMBULATORY_CARE_PROVIDER_SITE_OTHER): Payer: Medicare Other

## 2019-12-09 ENCOUNTER — Other Ambulatory Visit: Payer: Self-pay

## 2019-12-09 DIAGNOSIS — R0989 Other specified symptoms and signs involving the circulatory and respiratory systems: Secondary | ICD-10-CM

## 2019-12-09 DIAGNOSIS — I25118 Atherosclerotic heart disease of native coronary artery with other forms of angina pectoris: Secondary | ICD-10-CM | POA: Diagnosis not present

## 2019-12-31 ENCOUNTER — Ambulatory Visit: Payer: Self-pay | Admitting: Cardiology

## 2020-02-24 ENCOUNTER — Other Ambulatory Visit: Payer: Self-pay

## 2020-02-24 ENCOUNTER — Encounter: Payer: Self-pay | Admitting: Cardiology

## 2020-02-24 ENCOUNTER — Ambulatory Visit: Payer: Medicare Other | Admitting: Cardiology

## 2020-02-24 VITALS — BP 147/73 | HR 64 | Temp 97.9°F | Resp 16 | Ht 60.0 in | Wt 137.0 lb

## 2020-02-24 DIAGNOSIS — I25118 Atherosclerotic heart disease of native coronary artery with other forms of angina pectoris: Secondary | ICD-10-CM

## 2020-02-24 DIAGNOSIS — I10 Essential (primary) hypertension: Secondary | ICD-10-CM | POA: Diagnosis not present

## 2020-02-24 DIAGNOSIS — R0989 Other specified symptoms and signs involving the circulatory and respiratory systems: Secondary | ICD-10-CM

## 2020-02-24 DIAGNOSIS — E782 Mixed hyperlipidemia: Secondary | ICD-10-CM | POA: Diagnosis not present

## 2020-02-24 DIAGNOSIS — F17210 Nicotine dependence, cigarettes, uncomplicated: Secondary | ICD-10-CM | POA: Diagnosis not present

## 2020-02-24 DIAGNOSIS — F17200 Nicotine dependence, unspecified, uncomplicated: Secondary | ICD-10-CM

## 2020-02-24 MED ORDER — LOSARTAN POTASSIUM 50 MG PO TABS: 50.0000 mg | ORAL_TABLET | Freq: Every day | ORAL | 3 refills | Status: DC

## 2020-02-24 NOTE — Progress Notes (Signed)
Primary Physician/Referring:  Monico Blitz, MD  Patient ID: Kristy Hall, female    DOB: 12-04-1956, 64 y.o.   MRN: [id number]  Chief Complaint  Patient presents with  . Coronary Artery Disease    3 month follow up   HPI:    Kristy Hall  is a 64 y.o. Caucasian female with hypertension, mixed hyperlipidemia, tobacco use disorder, has at least a 30-40-pack-year history of smoking, was admitted with non-ST relation MI on 11/05/2019, underwent stenting to the circumflex coronary artery, discharged home the following day.  She has not had any further chest pains, unfortunately has started to smoke again.  Past medical history significant for hypertension, hyperlipidemia.  Presently doing well, has not used any sublingual nitroglycerin since last office visit 3 months ago.  Past Medical History:  Diagnosis Date  . Hypertension   . Mixed hyperlipidemia 11/05/2019  . Tobacco use disorder 11/05/2019   Past Surgical History:  Procedure Laterality Date  . ABDOMINAL HYSTERECTOMY    . BLADDER SUSPENSION    . CARPAL TUNNEL RELEASE Bilateral   . CORONARY STENT INTERVENTION N/A 11/05/2019   Procedure: CORONARY STENT INTERVENTION;  Surgeon: Adrian Prows, MD;  Location: [place] [facility];  Service: Cardiovascular;  Laterality: N/A;  . HEMORROIDECTOMY    . LEFT HEART CATH AND CORONARY ANGIOGRAPHY N/A 11/05/2019   Procedure: LEFT HEART CATH AND CORONARY ANGIOGRAPHY;  Surgeon: Adrian Prows, MD;  Location: [place] [facility];  Service: Cardiovascular;  Laterality: N/A;   Social History   Tobacco Use  . Smoking status: Current Every Day Smoker    Packs/day: 0.50    Years: 15.00    Pack years: 7.50    Types: Cigarettes  . Smokeless tobacco: Never Used  Substance Use Topics  . Alcohol use: Yes    Comment: occasional    History reviewed. No pertinent family history.   ROS  Review of Systems  Cardiovascular: Negative for chest pain, dyspnea on exertion and leg swelling.  Gastrointestinal:  Negative for melena.   Objective  Blood pressure (!) 147/73, pulse 64, temperature 97.9 F (36.6 C), temperature source Temporal, resp. rate 16, height 5' (1.524 m), weight 137 lb (62.1 kg), SpO2 96 %.  Vitals with BMI 02/24/2020 12/03/2019 11/06/2019  Height '5\' 0"'  '5\' 0"'  -  Weight 137 lbs 135 lbs 134 lbs 1 oz  BMI 26.76 51.02 58.52  Systolic 778 242 353  Diastolic 73 76 76  Pulse 64 73 62      Physical Exam  Constitutional:  Moderately built and well-nourished, appears older than stated age, in no acute distress.  Cardiovascular: Normal rate, regular rhythm, normal heart sounds and intact distal pulses. Exam reveals no gallop.  No murmur heard. Pulses:      Carotid pulses are on the right side with bruit and on the left side with bruit. No leg edema, no JVD.  Pulmonary/Chest: Effort normal and breath sounds normal.  Abdominal: Soft. Bowel sounds are normal.  Psychiatric: She has a normal mood and affect.   Laboratory examination:   Recent Labs    11/06/19 0430  NA 140  K 3.8  CL 107  CO2 23  GLUCOSE 94  BUN 12  CREATININE 0.79  CALCIUM 9.1  GFRNONAA >60  GFRAA >60   CrCl cannot be calculated (Patient's most recent lab result is older than the maximum 21 days allowed.).  CMP Latest Ref Rng & Units 11/06/2019  Glucose 70 - 99 mg/dL 94  BUN 8 - 23 mg/dL  12  Creatinine 0.44 - 1.00 mg/dL 0.79  Sodium 135 - 145 mmol/L 140  Potassium 3.5 - 5.1 mmol/L 3.8  Chloride 98 - 111 mmol/L 107  CO2 22 - 32 mmol/L 23  Calcium 8.9 - 10.3 mg/dL 9.1   CBC Latest Ref Rng & Units 11/06/2019  WBC 4.0 - 10.5 K/uL 10.2  Hemoglobin 12.0 - 15.0 g/dL 12.6  Hematocrit 36.0 - 46.0 % 37.8  Platelets 150 - 400 K/uL 230   Lipid Panel    External Labs 11/05/2019:   Sodium 140, potassium 3.3, BUN 17, creatinine 0.56, EGFR greater than 60 mL, CMP otherwise normal. D-dimer negative at 0.41. Hb12.9/HCT 39.0, platelets 267. Normal indicis.  Serum troponins serial: 0.04, 0.03, 0.06, 0.05,  abnormal, normal <0.01.  Total cholesterol 246, triglycerides 199, HDL 54, LDL 152.  No results found for: CHOL, TRIG, HDL, CHOLHDL, VLDL, LDLCALC, LDLDIRECT HEMOGLOBIN A1C No results found for: HGBA1C, MPG TSH No results for input(s): TSH in the last 8760 hours. Medications and allergies  No Known Allergies   Current Outpatient Medications  Medication Instructions  . acetaminophen (TYLENOL) 650 mg, Oral, Every 4 hours PRN  . Ascorbic Acid (VITAMIN C PO) Oral  . aspirin 81 mg, Oral, Daily  . atorvastatin (LIPITOR) 80 mg, Oral, Daily-1800  . LORazepam (ATIVAN) 0.5 mg, Oral, Daily  . losartan (COZAAR) 50 mg, Oral, Daily after supper  . metoprolol succinate (TOPROL-XL) 25 mg, Oral, Daily  . nitroGLYCERIN (NITROSTAT) 0.4 mg, Sublingual, Every 5 min x3 PRN  . ticagrelor (BRILINTA) 90 mg, Oral, 2 times daily  . VITAMIN D PO 1,000 mg, Oral   Radiology:  No results found. Cardiac Studies:   Left heart catheterization 11/05/2019: LV: Normal LV systolic function without wall motion abnormality. Normal LVEDP. RCA: Dominant. Proximal 40% stenosis. Mid to distal 20% stenosis. Left main: Normal. LAD: Large caliber vessel, diffusely diseased mild to moderately. No high-grade stenosis evident. Gives origin to a moderate sized D1 and several small D s. Ramus intermediate: Small sized vessel, tortuous, mild diffuse disease. Circumflex: Large area of distribution, gives origin to moderate sized OM1, proximal circumflex has a 95% ulcerated stenosis. SP 2.5 x 18 mm resolute Onyx DES reduced to 0% with TIMI-3 to TIMI-3 flow.  Carotid artery duplex 12/09/2019:  Stenosis in the right external carotid artery (<50%).  Minimal stenosis in the left internal carotid artery (minimal).  Stenosis in the left external carotid artery (<50%).  Antegrade right vertebral artery flow. Antegrade left vertebral artery flow.  Follow up studies when clinically indicated.  Echocardiogram 12/09/2019:  Left  ventricle cavity is normal in size. Mild concentric hypertrophy of the left ventricle. Normal global wall motion. Normal LV systolic function with EF 55%. Normal diastolic filling pattern. Calculated EF 55%.  Moderate (Grade II) mitral regurgitation.  Mild to moderate tricuspid regurgitation. Estimated pulmonary artery systolic pressure is 25 mmHg.  Insignificant pericardial effusion.  EKG 12/03/2019: Normal sinus rhythm at the rate of 67 bpm, normal axis, incomplete right bundle branch block no evidence of ischemia.  Normal EKG. EKG 11/20/2019: Normal sinus rhythm at the rate of 74 bpm, normal axis.  Low voltage complexes otherwise normal.  No significant change from 11/06/2019.  Assessment     ICD-10-CM   1. Coronary artery disease of native artery of native heart with stable angina pectoris (HCC)  I25.118 losartan (COZAAR) 50 MG tablet  2. Mixed hyperlipidemia  E78.2   3. Tobacco use disorder  F17.200   4. Bilateral carotid bruits  R09.89  5. Primary hypertension  I10 losartan (COZAAR) 50 MG tablet    Basic metabolic panel    Recommendations:   Meds ordered this encounter  Medications  . losartan (COZAAR) 50 MG tablet    Sig: Take 1 tablet (50 mg total) by mouth daily after supper.    Dispense:  30 tablet    Refill:  3    Nguyet Mercer  is a 64 y.o. Caucasian female with hypertension, mixed hyperlipidemia, tobacco use disorder, has at least a 30-40-pack-year history of smoking, was admitted with non-ST relation MI on 11/05/2019, underwent stenting to the circumflex coronary artery, discharged home the following day.  She has not had any further chest pains, unfortunately has started to smoke again.  Past medical history significant for hypertension, hyperlipidemia.  Unfortunately still smoking, advised her that she could  use patches, she has not filled this Rx. Try to set up a date for quitting. Offered medications.   Carotid duplex fortunately does not reveal significant stenosis  but does reveal heterogeneous plaque.  She needs aggressive risk factor modification.  Needs lipid status checked.  Blood pressure is well controlled, continue present medications, I will losartan 50 mg in the evening for cardiovascular protection and hypertension, BMP in 2 weeks, I will see her back in 3 months.  This is to improve compliance, specifically smoking cessation.Adrian Prows, MD, [facility] 02/24/2020, 9:04 AM [facility] [place] Office: [phone]

## 2020-04-10 ENCOUNTER — Other Ambulatory Visit: Payer: Self-pay | Admitting: Cardiology

## 2020-04-16 DIAGNOSIS — Z7902 Long term (current) use of antithrombotics/antiplatelets: Secondary | ICD-10-CM | POA: Diagnosis not present

## 2020-04-16 DIAGNOSIS — Z20822 Contact with and (suspected) exposure to covid-19: Secondary | ICD-10-CM | POA: Diagnosis not present

## 2020-04-16 DIAGNOSIS — Z79899 Other long term (current) drug therapy: Secondary | ICD-10-CM | POA: Diagnosis not present

## 2020-04-16 DIAGNOSIS — Z7982 Long term (current) use of aspirin: Secondary | ICD-10-CM | POA: Diagnosis not present

## 2020-04-16 DIAGNOSIS — I10 Essential (primary) hypertension: Secondary | ICD-10-CM | POA: Diagnosis not present

## 2020-04-16 DIAGNOSIS — K219 Gastro-esophageal reflux disease without esophagitis: Secondary | ICD-10-CM | POA: Diagnosis not present

## 2020-04-16 DIAGNOSIS — R0789 Other chest pain: Secondary | ICD-10-CM | POA: Diagnosis not present

## 2020-04-16 DIAGNOSIS — M79602 Pain in left arm: Secondary | ICD-10-CM | POA: Diagnosis not present

## 2020-04-16 DIAGNOSIS — R072 Precordial pain: Secondary | ICD-10-CM | POA: Diagnosis not present

## 2020-04-16 DIAGNOSIS — R079 Chest pain, unspecified: Secondary | ICD-10-CM | POA: Diagnosis not present

## 2020-04-16 DIAGNOSIS — Z87891 Personal history of nicotine dependence: Secondary | ICD-10-CM | POA: Diagnosis not present

## 2020-04-17 DIAGNOSIS — R079 Chest pain, unspecified: Secondary | ICD-10-CM | POA: Diagnosis not present

## 2020-04-17 DIAGNOSIS — Z20822 Contact with and (suspected) exposure to covid-19: Secondary | ICD-10-CM | POA: Diagnosis not present

## 2020-04-17 DIAGNOSIS — I10 Essential (primary) hypertension: Secondary | ICD-10-CM | POA: Diagnosis not present

## 2020-04-17 DIAGNOSIS — R0789 Other chest pain: Secondary | ICD-10-CM | POA: Diagnosis not present

## 2020-04-18 DIAGNOSIS — Z20822 Contact with and (suspected) exposure to covid-19: Secondary | ICD-10-CM | POA: Diagnosis not present

## 2020-04-18 DIAGNOSIS — R079 Chest pain, unspecified: Secondary | ICD-10-CM | POA: Diagnosis not present

## 2020-04-18 DIAGNOSIS — R0789 Other chest pain: Secondary | ICD-10-CM | POA: Diagnosis not present

## 2020-04-18 DIAGNOSIS — R072 Precordial pain: Secondary | ICD-10-CM | POA: Diagnosis not present

## 2020-04-18 DIAGNOSIS — I10 Essential (primary) hypertension: Secondary | ICD-10-CM | POA: Diagnosis not present

## 2020-05-02 ENCOUNTER — Ambulatory Visit: Payer: Medicare Other | Admitting: Dermatology

## 2020-05-02 ENCOUNTER — Other Ambulatory Visit: Payer: Self-pay

## 2020-05-02 ENCOUNTER — Encounter: Payer: Self-pay | Admitting: Dermatology

## 2020-05-02 DIAGNOSIS — D692 Other nonthrombocytopenic purpura: Secondary | ICD-10-CM | POA: Diagnosis not present

## 2020-05-02 DIAGNOSIS — L570 Actinic keratosis: Secondary | ICD-10-CM

## 2020-05-02 NOTE — Patient Instructions (Addendum)
First visit (perhaps in many years) for 1 disorders.  She has seen Dr. 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  Of particular concern is a crust on the left inner eye which comes and goes; today there is just a 1 mm fillable dark crust.  Her mom may have had a skin cancer in the same location which required some relatively aggressive surgery.  When we do the PDT in the late fall or winter we will try to apply the medication with a Q-tip to this area if it is still present.  For general facial moisturizer Kinnidi may try the CeraVe a lotion or CeraVe cream.  If she is interested in trying a nonprescription retinol, the oil of Olay Regenerist probably works (or does not work) as well as more expensive products.

## 2020-05-02 NOTE — Progress Notes (Addendum)
   New Patient   Subjective  Kristy Hall is a 64 y.o. female who presents for the following: Skin Problem (face- at times look red and inflammed).  Crusts Location: Face Duration: Years Quality: Increased number Associated Signs/Symptoms: Persistent hornlike crusts Modifying Factors: Dr. Tarri Glenn apparently frozen spots in the past, but no biopsies Severity:  Timing: Context: Mom apparently had a skin cancer in the right inner orbit which required fairly extensive therapy and this worries Kristy Hall.   The following portions of the chart were reviewed this encounter and updated as appropriate: Tobacco  Allergies  Meds  Problems  Med Hx  Surg Hx  Fam Hx      Objective  Well appearing patient in no apparent distress; mood and affect are within normal limits.  All sun exposed areas plus back examined.   Assessment & Plan  AK (actinic keratosis) (3) Head - Anterior (Face); Dorsum of Nose; Right Malar Cheek  [place] PDT in the late fall or winter  Destruction of lesion - Dorsum of Nose, Head - Anterior (Face), Right Malar Cheek  Other Related Procedures Photodynamic therapy  Senile purpura ([place]) (2) Left Forearm - Posterior; Right Forearm - Posterior  Topical Dermend daily to areas prone to bruise.  This is not a prescription but is also not a true cure. First visit (perhaps in many years) for [employer]..  She has seen Dr. 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  Of particular concern is a crust on the left inner eye which comes and goes; today there is just a 1 mm fillable dark crust.  Her mom may have had a skin cancer in the same location which required some relatively aggressive surgery.  When we do the PDT in the late fall or winter we will try to apply the medication with a Q-tip to this area if it is still present.  For general facial moisturizer Kristy Hall may try  the CeraVe a lotion or CeraVe cream.  If she is interested in trying a nonprescription retinol, the oil of Olay Regenerist probably works (or does not work) as well as more expensive products.

## 2020-05-03 DIAGNOSIS — Z299 Encounter for prophylactic measures, unspecified: Secondary | ICD-10-CM | POA: Diagnosis not present

## 2020-05-03 DIAGNOSIS — I10 Essential (primary) hypertension: Secondary | ICD-10-CM | POA: Diagnosis not present

## 2020-05-03 DIAGNOSIS — I2510 Atherosclerotic heart disease of native coronary artery without angina pectoris: Secondary | ICD-10-CM | POA: Diagnosis not present

## 2020-05-04 ENCOUNTER — Other Ambulatory Visit: Payer: Self-pay | Admitting: Cardiology

## 2020-05-23 ENCOUNTER — Other Ambulatory Visit: Payer: Self-pay

## 2020-05-23 DIAGNOSIS — I10 Essential (primary) hypertension: Secondary | ICD-10-CM

## 2020-05-23 DIAGNOSIS — I25118 Atherosclerotic heart disease of native coronary artery with other forms of angina pectoris: Secondary | ICD-10-CM

## 2020-05-23 MED ORDER — TICAGRELOR 90 MG PO TABS: 90.0000 mg | ORAL_TABLET | Freq: Two times a day (BID) | ORAL | 1 refills | Status: DC

## 2020-05-23 MED ORDER — LOSARTAN POTASSIUM 50 MG PO TABS: 50.0000 mg | ORAL_TABLET | Freq: Every day | ORAL | 1 refills | Status: DC

## 2020-05-23 MED ORDER — ATORVASTATIN CALCIUM 80 MG PO TABS: ORAL_TABLET | ORAL | 1 refills | Status: DC

## 2020-05-23 MED ORDER — METOPROLOL SUCCINATE ER 25 MG PO TB24: 25.0000 mg | ORAL_TABLET | Freq: Every day | ORAL | 1 refills | Status: DC

## 2020-05-24 NOTE — Progress Notes (Signed)
Primary Physician/Referring:  Monico Blitz, MD  Patient ID: Kristy Hall, female    DOB: 09-Nov-1956, 64 y.o.   MRN: [id number]  Chief Complaint  Patient presents with  . Coronary Artery Disease    3 MONTH F/U   HPI:    Kristy Hall  is a 64 y.o. 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  This is a 82-monthoffice visit, she had [facility] [place] she has had a negative nuclear stress test at that time.  She has not had any recurrence of chest pain since then.  She is tolerating all medications well.  She quit smoking since then.   Past Medical History:  Diagnosis Date  . Hypertension   . Mixed hyperlipidemia 11/05/2019  . Tobacco use disorder 11/05/2019   Past Surgical History:  Procedure Laterality Date  . ABDOMINAL HYSTERECTOMY    . BLADDER SUSPENSION    . CARPAL TUNNEL RELEASE Bilateral   . CORONARY STENT INTERVENTION N/A 11/05/2019   Procedure: CORONARY STENT INTERVENTION;  Surgeon: GAdrian Prows MD;  [facility];  Service: Cardiovascular;  Laterality: N/A;  . HEMORROIDECTOMY    . LEFT HEART CATH AND CORONARY ANGIOGRAPHY N/A 11/05/2019   Procedure: LEFT HEART CATH AND CORONARY ANGIOGRAPHY;  Surgeon: GAdrian Prows MD;  [facility];  Service: Cardiovascular;  Laterality: N/A;    Family History  Problem Relation Age of Onset  . COPD Mother   . Basal cell carcinoma Mother   . COPD Brother     Social History   Tobacco Use  . Smoking status: Former Smoker    Packs/day: 0.50    Years: 15.00    Pack years: 7.50    Types: Cigarettes    Quit date: 04/2020    Years since quitting: 0.0  . Smokeless tobacco: Never Used  Substance Use Topics  . Alcohol use: Yes    Comment: occasional   Marital Status:  Widowed  ROS  Review of Systems  Cardiovascular: Negative for chest pain, dyspnea on exertion and leg swelling.  Gastrointestinal: Negative for melena.   Objective  Blood pressure 123/77, pulse 64, resp. rate 15, height 5' (1.524 m), weight 138 lb (62.6 kg), SpO2 98 %.  Vitals with BMI 05/25/2020 02/24/2020 12/03/2019  Height '5\' 0"'  '5\' 0"'  '5\' 0"'   Weight 138 lbs 137 lbs 135 lbs  BMI 26.95 [phone] Systolic [id number] Diastolic 77 73 76  Pulse 64 64 73      Physical Exam  Constitutional:  Moderately built and well-nourished, appears older than stated age, in no acute distress.  Cardiovascular: Normal rate, regular rhythm, normal heart sounds and intact distal pulses. Exam reveals no gallop.  No murmur heard. Pulses:      Carotid pulses are on the right side with bruit and on the left side with bruit. No leg edema, no JVD.  Pulmonary/Chest: Effort normal and breath sounds normal.  Abdominal: Soft. Bowel sounds are normal.  Psychiatric: She has a normal mood and affect.   Laboratory examination:   Recent Labs    11/06/19 0430  NA 140  K 3.8  CL 107  CO2 23  GLUCOSE 94  BUN 12  CREATININE 0.79  CALCIUM 9.1  GFRNONAA >60  GFRAA >60   CrCl cannot  be calculated (Patient's most recent lab result is older than the maximum 21 days allowed.).  CMP Latest Ref Rng & Units 11/06/2019  Glucose 70 - 99 mg/dL 94  BUN 8 - 23 mg/dL 12  Creatinine 0.44 - 1.00 mg/dL 0.79  Sodium 135 - 145 mmol/L 140  Potassium 3.5 - 5.1 mmol/L 3.8  Chloride 98 - 111 mmol/L 107  CO2 22 - 32 mmol/L 23  Calcium 8.9 - 10.3 mg/dL 9.1   CBC Latest Ref Rng & Units 11/06/2019  WBC 4.0 - 10.5 K/uL 10.2  Hemoglobin 12.0 - 15.0 g/dL 12.6  Hematocrit 36.0 - 46.0 % 37.8  Platelets 150 - 400 K/uL 230   Lipid Panel   No results found for: CHOL, TRIG, HDL, CHOLHDL, VLDL, LDLCALC, LDLDIRECT HEMOGLOBIN A1C No results found for: HGBA1C, MPG TSH No results for input(s): TSH in the last 8760 hours.     External Labs:  Comprehensive Metabolic Panel 4/85/4627 [facility]  H/H 12/37, PLT 224. Rest of CBC normal.  Component Name Value Ref Range  Total Bilirubin 0.4 0.2 - 1.0 mg/dL  Potassium 3.1 (L) 3.5 - 5.1 mmol/L  Calcium 9.4 8.4 - 10.2 mg/dL  Protein, Total 6 6.0 - 8.5 g/dL  ALT 12 7 - 35 IU/L  Glucose 106 (H) 70 - 105 mg/dL  Albumin 3.9 3.5 - 5.0 g/dL  CO2 27.0 22.0 - 28.0 mmol/L  Anion Gap 14 7 - 16 mmol/L  AST 14.0 10 - 42 IU/L  Alkaline Phosphatase 101 (H) 32 - 92 IU/L  Sodium 141 136 - 145 mmol/L  Creatinine 0.57 (L) 0.6 - 1.3 mg/dL  Chloride 103 98 - 107 mmol/L  BUN 11 7 - 18 mg/dL  eGFR If NonAfricn Am >60 >60 ml/min    11/05/2019: Sodium 140, potassium 3.3, BUN 17, creatinine 0.56, EGFR greater than 60 mL, CMP otherwise normal. D-dimer negative at 0.41. Hb12.9/HCT 39.0, platelets 267. Normal indicis.  Serum troponins serial: 0.04, 0.03, 0.06, 0.05, abnormal, normal <0.01.  Total cholesterol 246, triglycerides 199, HDL 54, LDL 152.   Medications and allergies  No Known Allergies   Current Outpatient Medications  Medication Instructions  . acetaminophen (TYLENOL) 650 mg, Oral, Every 4 hours PRN  . Ascorbic Acid (VITAMIN C PO) Oral  . aspirin 81 mg, Oral, Daily  . atorvastatin (LIPITOR) 80 MG tablet TAKE 1 TABLET BY MOUTH ONCE DAILY AT  6PM  . LORazepam (ATIVAN) 0.5 mg, Oral, As needed  . losartan (COZAAR) 100 mg, Oral, Daily after supper  . metoprolol succinate (TOPROL-XL) 25 mg, Oral, Daily  . nitroGLYCERIN (NITROSTAT) 0.4 mg, Sublingual, Every 5 min x3 PRN  . omeprazole (PRILOSEC) 20 mg, Oral, As needed  . ticagrelor (BRILINTA) 90 mg, Oral, 2 times daily  . VITAMIN D PO 1,000 mg, Oral   Radiology:  No results found.   Cardiac Studies:   Left heart catheterization 11/05/2019: LV: Normal LV systolic function without wall motion abnormality. Normal LVEDP. RCA: Dominant. Proximal 40% stenosis. Mid to distal 20% stenosis. Left main:  Normal. LAD: Large caliber vessel, diffusely diseased mild to moderately. No high-grade stenosis evident. Gives origin to a moderate sized D1 and several small D s. Ramus intermediate: Small sized vessel, tortuous, mild diffuse disease. Circumflex: Large area of distribution, gives origin to moderate sized OM1, proximal circumflex has a 95% ulcerated stenosis. SP 2.5 x 18 mm resolute Onyx DES reduced to 0% with TIMI-3 to TIMI-3 flow.  Carotid artery duplex 12/09/2019:  Stenosis in the right  external carotid artery (<50%).  Minimal stenosis in the left internal carotid artery (minimal).  Stenosis in the left external carotid artery (<50%).  Antegrade right vertebral artery flow. Antegrade left vertebral artery flow.  Follow up studies when clinically indicated.  Echocardiogram 12/09/2019:  Left ventricle cavity is normal in size. Mild concentric hypertrophy of the left ventricle. Normal global wall motion. Normal LV systolic function with EF 55%. Normal diastolic filling pattern. Calculated EF 55%.  Moderate (Grade II) mitral regurgitation.  Mild to moderate tricuspid regurgitation. Estimated pulmonary artery systolic pressure is 25 mmHg.  Insignificant pericardial effusion.  Exercise tetrofosmin stress test  04/18/2020:([facility], [place], [place]): No reversible ischemia or infarction.  Mild apical thinning.  LVEF 60%.  Low risk. EKG:  EKG 05/25/2020: Normal sinus rhythm at rate of 58 bpm, normal axis, IRBBB, no evidence of ischemia, normal EKG. no significant change from 12/03/2019.   Assessment     ICD-10-CM   1. Coronary artery disease of native artery of native heart with stable angina pectoris (HCC)  I25.118 EKG 12-Lead    losartan (COZAAR) 100 MG tablet    ticagrelor (BRILINTA) 90 MG TABS tablet    metoprolol succinate (TOPROL-XL) 25 MG 24 hr tablet    DISCONTINUED: losartan (COZAAR) 100 MG tablet  2. Mixed hyperlipidemia  E78.2 Lipid Panel With LDL/HDL Ratio     atorvastatin (LIPITOR) 80 MG tablet  3. Primary hypertension  L46 Basic metabolic panel    TSH    losartan (COZAAR) 100 MG tablet    DISCONTINUED: losartan (COZAAR) 100 MG tablet  4. Tobacco use disorder  F17.200   5. Hypokalemia  E87.6      Outpatient Encounter Medications as of 05/25/2020  Medication Sig  . acetaminophen (TYLENOL) 325 MG tablet Take 2 tablets (650 mg total) by mouth every 4 (four) hours as needed for headache or mild pain.  . Ascorbic Acid (VITAMIN C PO) Take by mouth.  Marland Kitchen aspirin EC 81 MG EC tablet Take 1 tablet (81 mg total) by mouth daily.  Marland Kitchen LORazepam (ATIVAN) 0.5 MG tablet Take 0.5 mg by mouth as needed.   . nitroGLYCERIN (NITROSTAT) 0.4 MG SL tablet Place 1 tablet (0.4 mg total) under the tongue every 5 (five) minutes x 3 doses as needed for chest pain.  Marland Kitchen omeprazole (PRILOSEC) 20 MG capsule Take 20 mg by mouth as needed.  Marland Kitchen VITAMIN D PO Take 1,000 mg by mouth.  . [DISCONTINUED] atorvastatin (LIPITOR) 80 MG tablet TAKE 1 TABLET BY MOUTH ONCE DAILY AT  6PM  . [DISCONTINUED] losartan (COZAAR) 100 MG tablet Take 1 tablet (100 mg total) by mouth daily after supper.  . [DISCONTINUED] losartan (COZAAR) 50 MG tablet Take 1 tablet (50 mg total) by mouth daily after supper.  . [DISCONTINUED] metoprolol succinate (TOPROL-XL) 25 MG 24 hr tablet Take 1 tablet (25 mg total) by mouth daily.  . [DISCONTINUED] ticagrelor (BRILINTA) 90 MG TABS tablet Take 1 tablet (90 mg total) by mouth 2 (two) times daily.  Marland Kitchen atorvastatin (LIPITOR) 80 MG tablet TAKE 1 TABLET BY MOUTH ONCE DAILY AT  6PM  . losartan (COZAAR) 100 MG tablet Take 1 tablet (100 mg total) by mouth daily after supper.  . metoprolol succinate (TOPROL-XL) 25 MG 24 hr tablet Take 1 tablet (25 mg total) by mouth daily.  . ticagrelor (BRILINTA) 90 MG TABS tablet Take 1 tablet (90 mg total) by mouth 2 (two) times daily.   No facility-administered encounter medications on file as of 05/25/2020.  Recommendations:   Denene Alamillo   is a 64 y.o. 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  She was evaluated for chest pain on 04/17/2020 at [facility] in [place], and had negative nuclear stress test.  She has quit smoking since then.  I reviewed her labs, stress test reports as well.  I noticed that her potassium levels have been consistently low.  She is presently on losartan 50 mg daily, could certainly increase it to 100 mg daily although her blood pressure is very well controlled.  Otherwise she has had complete work-up including CMP and CBC but needs lipid profile testing.  I will see her back in 6 months for follow-up.  I will obtain labs.  I will discontinue Brilinta on her next office visit.  This was a 40-minute encounter with evaluation of follow-up medical records, external records and discussions regarding continued abstinence from tobacco and evaluation and management of hyperlipidemia along with coronary artery disease.  Adrian Prows, MD, [facility] 05/25/2020, 9:14 AM [facility] [place] Pager: [phone] Office: [phone]

## 2020-05-25 ENCOUNTER — Encounter: Payer: Self-pay | Admitting: Cardiology

## 2020-05-25 ENCOUNTER — Other Ambulatory Visit: Payer: Self-pay

## 2020-05-25 ENCOUNTER — Ambulatory Visit (INDEPENDENT_AMBULATORY_CARE_PROVIDER_SITE_OTHER): Payer: Medicare Other | Admitting: Cardiology

## 2020-05-25 VITALS — BP 123/77 | HR 64 | Resp 15 | Ht 60.0 in | Wt 138.0 lb

## 2020-05-25 DIAGNOSIS — E876 Hypokalemia: Secondary | ICD-10-CM

## 2020-05-25 DIAGNOSIS — E782 Mixed hyperlipidemia: Secondary | ICD-10-CM | POA: Diagnosis not present

## 2020-05-25 DIAGNOSIS — I25118 Atherosclerotic heart disease of native coronary artery with other forms of angina pectoris: Secondary | ICD-10-CM

## 2020-05-25 DIAGNOSIS — F17200 Nicotine dependence, unspecified, uncomplicated: Secondary | ICD-10-CM

## 2020-05-25 DIAGNOSIS — I10 Essential (primary) hypertension: Secondary | ICD-10-CM | POA: Diagnosis not present

## 2020-05-25 MED ORDER — LOSARTAN POTASSIUM 100 MG PO TABS: 100.0000 mg | ORAL_TABLET | Freq: Every day | ORAL | 3 refills | Status: DC

## 2020-05-25 MED ORDER — ATORVASTATIN CALCIUM 80 MG PO TABS: ORAL_TABLET | ORAL | 2 refills | Status: DC

## 2020-05-25 MED ORDER — METOPROLOL SUCCINATE ER 25 MG PO TB24: 25.0000 mg | ORAL_TABLET | Freq: Every day | ORAL | 3 refills | Status: DC

## 2020-05-25 MED ORDER — TICAGRELOR 90 MG PO TABS: 90.0000 mg | ORAL_TABLET | Freq: Two times a day (BID) | ORAL | 1 refills | Status: DC

## 2020-07-06 ENCOUNTER — Telehealth: Payer: Self-pay

## 2020-07-06 NOTE — Telephone Encounter (Signed)
I replied that she can take it on her portal

## 2020-07-06 NOTE — Telephone Encounter (Signed)
Patient called asking if Gabapentin prescribed by her PCP,  is safe for her to take with all other medications that she takes. Please advise.      Patient has not yet set up MyChart, but I transferred call to [facility] to help her with that.

## 2020-07-07 NOTE — Telephone Encounter (Signed)
Tried to call patient, phone disconnected

## 2020-09-21 DIAGNOSIS — E7849 Other hyperlipidemia: Secondary | ICD-10-CM | POA: Diagnosis not present

## 2020-09-21 DIAGNOSIS — M1990 Unspecified osteoarthritis, unspecified site: Secondary | ICD-10-CM | POA: Diagnosis not present

## 2020-09-21 DIAGNOSIS — I10 Essential (primary) hypertension: Secondary | ICD-10-CM | POA: Diagnosis not present

## 2020-10-11 ENCOUNTER — Other Ambulatory Visit: Payer: Self-pay

## 2020-10-11 ENCOUNTER — Encounter: Payer: Self-pay | Admitting: Dermatology

## 2020-10-11 ENCOUNTER — Ambulatory Visit: Payer: Medicare Other | Admitting: Dermatology

## 2020-10-11 DIAGNOSIS — D489 Neoplasm of uncertain behavior, unspecified: Secondary | ICD-10-CM | POA: Diagnosis not present

## 2020-10-11 DIAGNOSIS — L570 Actinic keratosis: Secondary | ICD-10-CM

## 2020-10-17 ENCOUNTER — Other Ambulatory Visit: Payer: Self-pay

## 2020-10-17 DIAGNOSIS — I25118 Atherosclerotic heart disease of native coronary artery with other forms of angina pectoris: Secondary | ICD-10-CM

## 2020-10-18 ENCOUNTER — Other Ambulatory Visit: Payer: Self-pay

## 2020-10-18 ENCOUNTER — Ambulatory Visit: Payer: Medicare Other | Admitting: Dermatology

## 2020-10-18 DIAGNOSIS — C44722 Squamous cell carcinoma of skin of right lower limb, including hip: Secondary | ICD-10-CM | POA: Diagnosis not present

## 2020-10-18 DIAGNOSIS — D485 Neoplasm of uncertain behavior of skin: Secondary | ICD-10-CM

## 2020-10-18 DIAGNOSIS — C4492 Squamous cell carcinoma of skin, unspecified: Secondary | ICD-10-CM

## 2020-10-18 HISTORY — DX: Squamous cell carcinoma of skin, unspecified: C44.92

## 2020-10-18 NOTE — Patient Instructions (Signed)

## 2020-10-23 ENCOUNTER — Telehealth: Payer: Self-pay | Admitting: Dermatology

## 2020-10-23 NOTE — Telephone Encounter (Signed)
Patient calling for pathology results.Pt was told it would be tomorrow

## 2020-10-24 ENCOUNTER — Encounter: Payer: Self-pay | Admitting: Dermatology

## 2020-10-27 ENCOUNTER — Telehealth: Payer: Self-pay | Admitting: *Deleted

## 2020-10-27 NOTE — Telephone Encounter (Signed)
-----   Message from Lavonna Monarch, MD sent at 10/27/2020  6:13 AM EDT ----- Schedule surgery with Dr. Darene Lamer

## 2020-10-27 NOTE — Telephone Encounter (Signed)
Path to patient. Patient already had a surgical appointment scheduled for 12/21/20.

## 2020-11-19 ENCOUNTER — Encounter: Payer: Self-pay | Admitting: Dermatology

## 2020-11-19 NOTE — Progress Notes (Signed)
   Follow-Up Visit   Subjective  Kristy Hall is a 64 y.o. female who presents for the following: Skin Problem (right side of leg non healing x 5 weeks stings).  Nonhealing crust Location: Right chin Duration:  Quality:  Associated Signs/Symptoms: Modifying Factors:  Severity:  Timing: Context:   Objective  Well appearing patient in no apparent distress; mood and affect are within normal limits.  A focused examination was performed including Legs. Relevant physical exam findings are noted in the Assessment and Plan.   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     I, Lavonna Monarch, MD, have reviewed all documentation for this visit.  The documentation on 11/19/20 for the exam, diagnosis, procedures, and orders are all accurate and complete.

## 2020-11-21 DIAGNOSIS — M1990 Unspecified osteoarthritis, unspecified site: Secondary | ICD-10-CM | POA: Diagnosis not present

## 2020-11-21 DIAGNOSIS — I10 Essential (primary) hypertension: Secondary | ICD-10-CM | POA: Diagnosis not present

## 2020-11-21 DIAGNOSIS — E7849 Other hyperlipidemia: Secondary | ICD-10-CM | POA: Diagnosis not present

## 2020-11-23 ENCOUNTER — Other Ambulatory Visit: Payer: Self-pay | Admitting: Cardiology

## 2020-11-23 DIAGNOSIS — I25118 Atherosclerotic heart disease of native coronary artery with other forms of angina pectoris: Secondary | ICD-10-CM

## 2020-11-24 ENCOUNTER — Ambulatory Visit: Payer: Medicare Other | Admitting: Cardiology

## 2020-11-29 ENCOUNTER — Ambulatory Visit: Payer: Medicare Other | Admitting: Cardiology

## 2020-11-30 ENCOUNTER — Other Ambulatory Visit: Payer: Self-pay | Admitting: Cardiology

## 2020-11-30 DIAGNOSIS — E782 Mixed hyperlipidemia: Secondary | ICD-10-CM

## 2020-12-01 DIAGNOSIS — J069 Acute upper respiratory infection, unspecified: Secondary | ICD-10-CM | POA: Diagnosis not present

## 2020-12-01 DIAGNOSIS — I10 Essential (primary) hypertension: Secondary | ICD-10-CM | POA: Diagnosis not present

## 2020-12-01 DIAGNOSIS — Z299 Encounter for prophylactic measures, unspecified: Secondary | ICD-10-CM | POA: Diagnosis not present

## 2020-12-01 DIAGNOSIS — E7800 Pure hypercholesterolemia, unspecified: Secondary | ICD-10-CM | POA: Diagnosis not present

## 2020-12-13 DIAGNOSIS — Z79899 Other long term (current) drug therapy: Secondary | ICD-10-CM | POA: Diagnosis not present

## 2020-12-13 DIAGNOSIS — I10 Essential (primary) hypertension: Secondary | ICD-10-CM | POA: Diagnosis not present

## 2020-12-13 DIAGNOSIS — R5383 Other fatigue: Secondary | ICD-10-CM | POA: Diagnosis not present

## 2020-12-13 DIAGNOSIS — E559 Vitamin D deficiency, unspecified: Secondary | ICD-10-CM | POA: Diagnosis not present

## 2020-12-13 DIAGNOSIS — Z7189 Other specified counseling: Secondary | ICD-10-CM | POA: Diagnosis not present

## 2020-12-13 DIAGNOSIS — Z299 Encounter for prophylactic measures, unspecified: Secondary | ICD-10-CM | POA: Diagnosis not present

## 2020-12-13 DIAGNOSIS — Z0000 Encounter for general adult medical examination without abnormal findings: Secondary | ICD-10-CM | POA: Diagnosis not present

## 2020-12-13 DIAGNOSIS — E7800 Pure hypercholesterolemia, unspecified: Secondary | ICD-10-CM | POA: Diagnosis not present

## 2020-12-21 ENCOUNTER — Encounter: Payer: Self-pay | Admitting: Dermatology

## 2020-12-21 ENCOUNTER — Ambulatory Visit (INDEPENDENT_AMBULATORY_CARE_PROVIDER_SITE_OTHER): Payer: Medicare Other | Admitting: Dermatology

## 2020-12-21 ENCOUNTER — Other Ambulatory Visit: Payer: Self-pay

## 2020-12-21 DIAGNOSIS — C44622 Squamous cell carcinoma of skin of right upper limb, including shoulder: Secondary | ICD-10-CM

## 2020-12-21 DIAGNOSIS — C4492 Squamous cell carcinoma of skin, unspecified: Secondary | ICD-10-CM

## 2020-12-21 DIAGNOSIS — C44722 Squamous cell carcinoma of skin of right lower limb, including hip: Secondary | ICD-10-CM | POA: Diagnosis not present

## 2020-12-21 MED ORDER — MUPIROCIN 2 % EX OINT: 1.0000 "application " | TOPICAL_OINTMENT | Freq: Every day | CUTANEOUS | 0 refills | Status: DC

## 2020-12-21 NOTE — Patient Instructions (Signed)

## 2020-12-22 DIAGNOSIS — I10 Essential (primary) hypertension: Secondary | ICD-10-CM | POA: Diagnosis not present

## 2020-12-22 DIAGNOSIS — E7849 Other hyperlipidemia: Secondary | ICD-10-CM | POA: Diagnosis not present

## 2020-12-22 DIAGNOSIS — M1990 Unspecified osteoarthritis, unspecified site: Secondary | ICD-10-CM | POA: Diagnosis not present

## 2020-12-24 ENCOUNTER — Encounter: Payer: Self-pay | Admitting: Dermatology

## 2020-12-24 NOTE — Progress Notes (Signed)
   Follow-Up Visit   Subjective  Kristy Hall is a 65 y.o. female who presents for the following: Procedure (Patient here today for treatment SCC x 1 right lower leg anterior ).  SCCA Location: Right leg Duration:  Quality:  Associated Signs/Symptoms: Modifying Factors:  Severity:  Timing: Context: For treatment  Objective  Well appearing patient in no apparent distress; mood and affect are within normal limits. Objective  Right Lower Leg - Anterior: Lesion identified by Dr.Bricen Victory and nurse in room.   [phone]    A focused examination was performed including Legs. Relevant physical exam findings are noted in the Assessment and Plan.   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  mupirocin ointment (BACTROBAN) 2 %  Skin excision  Lesion length (cm):  2.2 Lesion width (cm):  1.7 Margin per side (cm):  0.1 Total excision diameter (cm):  2.4 Informed consent: discussed and consent obtained   Timeout: patient name, date of birth, surgical site, and procedure verified   Procedure prep:  Patient was prepped and draped in usual sterile fashion Prep type:  Chlorhexidine Anesthesia: the lesion was anesthetized in a standard fashion   Anesthetic:  1% lidocaine w/ epinephrine 1-100,000 local infiltration Instrument used: #15 blade   Hemostasis achieved with: suture   Outcome: patient tolerated procedure well with no complications   Post-procedure details: sterile dressing applied   Dressing type: petrolatum    Skin repair Complexity:  Intermediate Final length (cm):  3.5 Informed consent: discussed and consent obtained   Reason for type of repair: reduce tension to allow closure, reduce the risk of dehiscence, infection, and necrosis and reduce subcutaneous dead space and avoid a hematoma   Subcutaneous layers (deep stitches):  Suture size:  4-0 Suture type: Vicryl (polyglactin 910)   Fine/surface layer approximation (top  stitches):  Suture size:  4-0 Suture type: nylon   Suture removal (days):  14 Hemostasis achieved with: suture  Specimen 1 - Surgical pathology Differential Diagnosis: R/O SCC  Check Margins: Yes  Lesion identified by Dr.Fusako Tanabe and nurse in room.  [phone]   Curettage showed deep dermal extension so after curettage x3 the base and edges electrocauterized, recuretted, narrow margin excision with layered closure.  4-0 Vicryl x 1 4-0 Ethilon x 8      I, Janalyn Harder, MD, have reviewed all documentation for this visit.  The documentation on 12/24/20 for the exam, diagnosis, procedures, and orders are all accurate and complete.

## 2020-12-26 ENCOUNTER — Other Ambulatory Visit: Payer: Self-pay

## 2020-12-26 ENCOUNTER — Encounter: Payer: Self-pay | Admitting: Cardiology

## 2020-12-26 ENCOUNTER — Ambulatory Visit: Payer: Medicare Other | Admitting: Cardiology

## 2020-12-26 VITALS — BP 129/76 | HR 62 | Resp 16 | Ht 60.0 in | Wt 137.0 lb

## 2020-12-26 DIAGNOSIS — E782 Mixed hyperlipidemia: Secondary | ICD-10-CM | POA: Diagnosis not present

## 2020-12-26 DIAGNOSIS — I10 Essential (primary) hypertension: Secondary | ICD-10-CM

## 2020-12-26 DIAGNOSIS — I25118 Atherosclerotic heart disease of native coronary artery with other forms of angina pectoris: Secondary | ICD-10-CM

## 2020-12-26 DIAGNOSIS — F17200 Nicotine dependence, unspecified, uncomplicated: Secondary | ICD-10-CM

## 2020-12-26 DIAGNOSIS — F17210 Nicotine dependence, cigarettes, uncomplicated: Secondary | ICD-10-CM | POA: Diagnosis not present

## 2020-12-26 MED ORDER — ASPIRIN 81 MG PO CHEW: 81.0000 mg | CHEWABLE_TABLET | Freq: Every day | ORAL | Status: AC

## 2020-12-26 NOTE — Progress Notes (Signed)
Primary Physician/Referring:  Monico Blitz, MD  Patient ID: Kristy Hall, female    DOB: [birth year], 65 y.o.   MRN: [id number]  Chief Complaint  Patient presents with  . Coronary Artery Disease  . Hyperlipidemia  . Follow-up    6 months   HPI:    Kristy Hall  is a 65 y.o. 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  This is a 72-monthoffice visit, [facility] 2021, [facility] since then she had quit smoking.  However unfortunately she has begun smoking again of smoking about half pack of cigarettes a day.  She has not had any chest pain, no shortness of breath, states that she continues to remain active.  Past Medical History:  Diagnosis Date  . Hypertension   . Mixed hyperlipidemia 11/05/2019  . SCCA (squamous cell carcinoma) of skin 10/18/2020   Right Lower Leg-Anterior (well diff)   . Tobacco use disorder 11/05/2019   Past Surgical History:  Procedure Laterality Date  . ABDOMINAL HYSTERECTOMY    . BLADDER SUSPENSION    . CARPAL TUNNEL RELEASE Bilateral   . CORONARY STENT INTERVENTION N/A 11/05/2019   Procedure: CORONARY STENT INTERVENTION;  Surgeon: GAdrian Prows MD;  [facility];  Service: Cardiovascular;  Laterality: N/A;  . HEMORROIDECTOMY    . LEFT HEART CATH AND CORONARY ANGIOGRAPHY N/A 11/05/2019   Procedure: LEFT HEART CATH AND CORONARY ANGIOGRAPHY;  Surgeon: GAdrian Prows MD;  [facility];  Service: Cardiovascular;  Laterality: N/A;    Family History  Problem Relation Age of Onset  . COPD Mother   . Basal cell carcinoma Mother   . COPD Brother     Social History   Tobacco Use  . Smoking status: Former Smoker    Packs/day: 0.50    Years: 15.00    Pack years: 7.50    Types: Cigarettes     Quit date: 04/2020    Years since quitting: 0.6  . Smokeless tobacco: Never Used  Substance Use Topics  . Alcohol use: Yes    Comment: occasional   Marital Status: Widowed  ROS  Review of Systems  Cardiovascular: Negative for chest pain, dyspnea on exertion and leg swelling.  Gastrointestinal: Negative for melena.   Objective  Blood pressure 129/76, pulse 62, resp. rate 16, height 5' (1.524 m), weight 137 lb (62.1 kg), SpO2 99 %.  Vitals with BMI 12/26/2020 05/25/2020 02/24/2020  Height '5\' 0"'  '5\' 0"'  '5\' 0"'   Weight 137 lbs 138 lbs 137 lbs  BMI 26.76 [id number] Systolic [id number] Diastolic 76 77 73  Pulse 62 64 64      Physical Exam Constitutional:      Comments: Moderately built and well-nourished, appears older than stated age, in no acute distress.  Cardiovascular:     Rate and Rhythm: Normal rate and regular rhythm.     Pulses: Intact distal pulses.          Carotid pulses are on the right side with bruit and on the left side with bruit.    Heart sounds: Normal heart sounds. No murmur heard. No gallop.      Comments: No leg edema, no JVD. Pulmonary:     Effort: Pulmonary effort is normal.     Breath sounds: Normal  breath sounds.  Abdominal:     General: Bowel sounds are normal.     Palpations: Abdomen is soft.    Laboratory examination:   No results for input(s): NA, K, CL, CO2, GLUCOSE, BUN, CREATININE, CALCIUM, GFRNONAA, GFRAA in the last 8760 hours. CrCl cannot be calculated (Patient's most recent lab result is older than the maximum 21 days allowed.).  CMP Latest Ref Rng & Units 11/06/2019  Glucose 70 - 99 mg/dL 94  BUN 8 - 23 mg/dL 12  Creatinine 0.44 - 1.00 mg/dL 0.79  Sodium 135 - 145 mmol/L 140  Potassium 3.5 - 5.1 mmol/L 3.8  Chloride 98 - 111 mmol/L 107  CO2 22 - 32 mmol/L 23  Calcium 8.9 - 10.3 mg/dL 9.1   CBC Latest Ref Rng & Units 11/06/2019  WBC 4.0 - 10.5 K/uL 10.2  Hemoglobin 12.0 - 15.0 g/dL 12.6  Hematocrit 36.0 - 46.0 % 37.8  Platelets  150 - 400 K/uL 230   Lipid Panel   No results found for: CHOL, TRIG, HDL, CHOLHDL, VLDL, LDLCALC, LDLDIRECT HEMOGLOBIN A1C No results found for: HGBA1C, MPG TSH No results for input(s): TSH in the last 8760 hours.    External Labs:   Lab 04/16/2020:  Hb 12.0/HCT 37.1, platelets 224.  BUN 11, creatinine 0.57, EGFR >60 mL.  Potassium 3.1, sodium 141.  11/05/2019:   Sodium 140, potassium 3.3, BUN 17, creatinine 0.56, EGFR greater than 60 mL, CMP otherwise normal. D-dimer negative at 0.41. Hb12.9/HCT 39.0, platelets 267. Normal indicis.  Serum troponins serial: 0.04, 0.03, 0.06, 0.05, abnormal, normal <0.01.  Total cholesterol 246, triglycerides 199, HDL 54, LDL 152.   Medications and allergies  No Known Allergies  Current Outpatient Medications on File Prior to Visit  Medication Sig Dispense Refill  . acetaminophen (TYLENOL) 325 MG tablet Take 2 tablets (650 mg total) by mouth every 4 (four) hours as needed for headache or mild pain.    . Ascorbic Acid (VITAMIN C PO) Take by mouth.    Marland Kitchen atorvastatin (LIPITOR) 80 MG tablet TAKE 1 TABLET BY MOUTH ONCE DAILY AT 6PM 90 tablet 3  . LORazepam (ATIVAN) 0.5 MG tablet Take 0.5 mg by mouth as needed.     Marland Kitchen losartan (COZAAR) 100 MG tablet Take 1 tablet (100 mg total) by mouth daily after supper. 90 tablet 3  . metoprolol succinate (TOPROL-XL) 25 MG 24 hr tablet Take 1 tablet (25 mg total) by mouth daily. 90 tablet 3  . mupirocin ointment (BACTROBAN) 2 % Apply 1 application topically daily. 22 g 0  . nitroGLYCERIN (NITROSTAT) 0.4 MG SL tablet Place 1 tablet (0.4 mg total) under the tongue every 5 (five) minutes x 3 doses as needed for chest pain. 25 tablet 3  . omeprazole (PRILOSEC) 20 MG capsule Take 20 mg by mouth as needed.    Marland Kitchen VITAMIN D PO Take 1,000 mg by mouth.     No current facility-administered medications on file prior to visit.    Radiology:  No results found.   Cardiac Studies:   Left heart catheterization  11/05/2019: LV: Normal LV systolic function without wall motion abnormality. Normal LVEDP. RCA: Dominant. Proximal 40% stenosis. Mid to distal 20% stenosis. Left main: Normal. LAD: Large caliber vessel, diffusely diseased mild to moderately. No high-grade stenosis evident. Gives origin to a moderate sized D1 and several small D s. Ramus intermediate: Small sized vessel, tortuous, mild diffuse disease. Circumflex: Large area of distribution, gives origin to moderate sized OM1, proximal circumflex has  a 95% ulcerated stenosis. SP 2.5 x 18 mm resolute Onyx DES reduced to 0% with TIMI-3 to TIMI-3 flow.  Carotid artery duplex 12/09/2019:  Stenosis in the right external carotid artery (<50%).  Minimal stenosis in the left internal carotid artery (minimal).  Stenosis in the left external carotid artery (<50%).  Antegrade right vertebral artery flow. Antegrade left vertebral artery flow.  Follow up studies when clinically indicated.  Echocardiogram 12/09/2019:  Left ventricle cavity is normal in size. Mild concentric hypertrophy of the left ventricle. Normal global wall motion. Normal LV systolic function with EF 55%. Normal diastolic filling pattern. Calculated EF 55%.  Moderate (Grade II) mitral regurgitation.  Mild to moderate tricuspid regurgitation. Estimated pulmonary artery systolic pressure is 25 mmHg.  Insignificant pericardial effusion.  Exercise tetrofosmin stress test  04/18/2020:([facility], [place], [place]): No reversible ischemia or infarction.  Mild apical thinning.  LVEF 60%.  Low risk.  EKG:    EKG 12/26/2020: Normal sinus rhythm at rate of 64 beats minute, normal axis.  Incomplete right bundle branch block.  No evidence of ischemia.  No change from 05/25/2020.    Assessment     ICD-10-CM   1. Coronary artery disease of native artery of native heart with stable angina pectoris (HCC)  I25.118 EKG 12-Lead    aspirin (ASPIRIN CHILDRENS) 81 MG chewable tablet  2. Mixed  hyperlipidemia  E78.2   3. Primary hypertension  I10   4. Tobacco use disorder  F17.200     Meds ordered this encounter  Medications  . aspirin (ASPIRIN CHILDRENS) 81 MG chewable tablet    Sig: Chew 1 tablet (81 mg total) by mouth daily.   Medications Discontinued During This Encounter  Medication Reason  . BRILINTA 90 MG TABS tablet Completed Course  . aspirin EC 81 MG EC tablet Change in therapy    Orders Placed This Encounter  Procedures  . EKG 12-Lead     Recommendations:   Megen Madewell  is a 65 y.o. 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  She was evaluated for chest pain on 04/17/2020 at [facility] in [place], and had negative nuclear stress test.  She had quit smoking but has resumed smoking again.  I reviewed her stress test, carotid artery duplex again with the patient.  Concerned about progression of coronary disease and potentially vascular disease discussed in detail in view of continued smoking and offered her medications and nicotine patch.  With regard to hypertension and hyperlipidemia, blood pressure is well controlled today, lipids being managed by PCP with goal LDL <70.  Otherwise remained stable without recurrence of angina pectoris or clinical evidence of heart failure, I will see her back on annual basis.  I will try to obtain labs from her PCP.   Adrian Prows, MD, [facility] 12/26/2020, 4:08 PM Office: [phone] Pager: [phone]

## 2021-01-04 ENCOUNTER — Other Ambulatory Visit: Payer: Self-pay

## 2021-01-04 ENCOUNTER — Ambulatory Visit (INDEPENDENT_AMBULATORY_CARE_PROVIDER_SITE_OTHER): Payer: Medicare Other

## 2021-01-04 DIAGNOSIS — Z4802 Encounter for removal of sutures: Secondary | ICD-10-CM

## 2021-01-04 NOTE — Progress Notes (Signed)
PATH TO PATIENT MARGIN FREE STERI STRIP APPLIED NO SIGNS OR SYMPTOMS OF INFECTION.

## 2021-01-22 DIAGNOSIS — E7849 Other hyperlipidemia: Secondary | ICD-10-CM | POA: Diagnosis not present

## 2021-01-22 DIAGNOSIS — I10 Essential (primary) hypertension: Secondary | ICD-10-CM | POA: Diagnosis not present

## 2021-01-22 DIAGNOSIS — M1990 Unspecified osteoarthritis, unspecified site: Secondary | ICD-10-CM | POA: Diagnosis not present

## 2021-02-11 DIAGNOSIS — R079 Chest pain, unspecified: Secondary | ICD-10-CM | POA: Diagnosis not present

## 2021-02-11 DIAGNOSIS — R072 Precordial pain: Secondary | ICD-10-CM | POA: Diagnosis not present

## 2021-02-11 DIAGNOSIS — R001 Bradycardia, unspecified: Secondary | ICD-10-CM | POA: Diagnosis not present

## 2021-02-11 DIAGNOSIS — Z720 Tobacco use: Secondary | ICD-10-CM | POA: Diagnosis not present

## 2021-02-11 DIAGNOSIS — I700 Atherosclerosis of aorta: Secondary | ICD-10-CM | POA: Diagnosis not present

## 2021-02-11 DIAGNOSIS — J439 Emphysema, unspecified: Secondary | ICD-10-CM | POA: Diagnosis not present

## 2021-02-11 DIAGNOSIS — R9431 Abnormal electrocardiogram [ECG] [EKG]: Secondary | ICD-10-CM | POA: Diagnosis not present

## 2021-02-16 DIAGNOSIS — I350 Nonrheumatic aortic (valve) stenosis: Secondary | ICD-10-CM | POA: Diagnosis not present

## 2021-02-16 DIAGNOSIS — Q231 Congenital insufficiency of aortic valve: Secondary | ICD-10-CM | POA: Diagnosis not present

## 2021-02-21 ENCOUNTER — Other Ambulatory Visit: Payer: Self-pay | Admitting: Cardiology

## 2021-02-21 DIAGNOSIS — I25118 Atherosclerotic heart disease of native coronary artery with other forms of angina pectoris: Secondary | ICD-10-CM

## 2021-02-21 DIAGNOSIS — I10 Essential (primary) hypertension: Secondary | ICD-10-CM

## 2021-02-26 ENCOUNTER — Ambulatory Visit: Payer: Medicare Other | Admitting: Dermatology

## 2021-02-26 ENCOUNTER — Other Ambulatory Visit: Payer: Self-pay

## 2021-02-26 DIAGNOSIS — L570 Actinic keratosis: Secondary | ICD-10-CM | POA: Diagnosis not present

## 2021-02-26 MED ORDER — AMINOLEVULINIC ACID HCL 10 % EX GEL
2000.0000 mg | Freq: Once | CUTANEOUS | Status: AC
  Administered 2021-02-26: 2000 mg via TOPICAL

## 2021-02-26 NOTE — Progress Notes (Signed)
Photodynamic Therapy Procedure Note Diagnosis: Actinic keratosis Location: Face Informed Consent: Discussed risks (burning, pain, redness, peeling, severe sunburn-like reaction, blistering, discoloration, lack of resolution) and benefits of the procedure, as well as the alternatives. Informed consent was obtained. Preparation: After cleansing the skin, the area to be treated was coated with Levulan.  This was allowed to sit on the skin for 90 minutes. Procedure Details: The patient was placed under the light source with appropriate eye protection for 16 minutes 42 seconds. After completing the treatment, the patient applied sunscreen to the treated areas. Patient tolerated the procedure well Plan: Avoid any sun exposure for the next 24 hours. Wear sunscreen daily for the next week. Observe normal sun precautions thereafter. Recommend OTC analgesia as needed for pain. Follow-up in 10 weeks.

## 2021-02-26 NOTE — Patient Instructions (Signed)

## 2021-03-13 ENCOUNTER — Encounter: Payer: Self-pay | Admitting: Dermatology

## 2021-03-14 NOTE — Progress Notes (Signed)
   Follow-Up Visit   Subjective  Kristy Hall is a 65 y.o. female who presents for the following: Photodynamic Therapy (Patient here today for PDT).  Actinic keratoses Location: Face Duration:  Quality:  Associated Signs/Symptoms: Modifying Factors:  Severity:  Timing: Context: History of multiple skin cancers  Objective  Well appearing patient in no apparent distress; mood and affect are within normal limits. Objective  Head - Anterior (Face): Diffuse small gritty pink crusts of actinic keratoses.  Some hypertrophic lesions will be debrided before the PDT.    A focused examination was performed including face, neck, chest and back and Head and neck.. Relevant physical exam findings are noted in the Assessment and Plan.   Assessment & Plan    AK (actinic keratosis) Head - Anterior (Face)  Photodynamic therapy - Head - Anterior (Face) Procedure discussed: discussed risks, benefits, side effects. and alternatives   Prep: site scrubbed/prepped with acetone   Debridement needed: Yes   Location:  Face Type of treatment:  Blue light Aminolevulinic Acid (see MAR for details): Ameluz Amount of Ameluz (mg):  2000 Incubation time (minutes):  90 Number of minutes under lamp:  16 Number of seconds under lamp:  42 Cooling:  Fan and floor fan Post-procedure details: sunscreen applied and aftercare instructions given to patient    Aminolevulinic Acid HCl 10 % GEL 2,000 mg - Head - Anterior (Face)      I, Lavonna Monarch, MD, have reviewed all documentation for this visit.  The documentation on 03/14/21 for the exam, diagnosis, procedures, and orders are all accurate and complete.

## 2021-03-21 DIAGNOSIS — E7849 Other hyperlipidemia: Secondary | ICD-10-CM | POA: Diagnosis not present

## 2021-03-21 DIAGNOSIS — M1990 Unspecified osteoarthritis, unspecified site: Secondary | ICD-10-CM | POA: Diagnosis not present

## 2021-03-21 DIAGNOSIS — E1165 Type 2 diabetes mellitus with hyperglycemia: Secondary | ICD-10-CM | POA: Diagnosis not present

## 2021-04-16 DIAGNOSIS — Z299 Encounter for prophylactic measures, unspecified: Secondary | ICD-10-CM | POA: Diagnosis not present

## 2021-04-16 DIAGNOSIS — F17210 Nicotine dependence, cigarettes, uncomplicated: Secondary | ICD-10-CM | POA: Diagnosis not present

## 2021-04-16 DIAGNOSIS — I10 Essential (primary) hypertension: Secondary | ICD-10-CM | POA: Diagnosis not present

## 2021-04-16 DIAGNOSIS — I25119 Atherosclerotic heart disease of native coronary artery with unspecified angina pectoris: Secondary | ICD-10-CM | POA: Diagnosis not present

## 2021-04-26 ENCOUNTER — Other Ambulatory Visit: Payer: Self-pay | Admitting: Cardiology

## 2021-04-26 DIAGNOSIS — I25118 Atherosclerotic heart disease of native coronary artery with other forms of angina pectoris: Secondary | ICD-10-CM

## 2021-05-09 ENCOUNTER — Other Ambulatory Visit: Payer: Self-pay

## 2021-05-09 ENCOUNTER — Ambulatory Visit: Payer: Medicare Other | Admitting: Dermatology

## 2021-05-09 DIAGNOSIS — L570 Actinic keratosis: Secondary | ICD-10-CM | POA: Diagnosis not present

## 2021-05-21 DIAGNOSIS — F32A Depression, unspecified: Secondary | ICD-10-CM | POA: Diagnosis not present

## 2021-05-21 DIAGNOSIS — I10 Essential (primary) hypertension: Secondary | ICD-10-CM | POA: Diagnosis not present

## 2021-05-23 ENCOUNTER — Encounter: Payer: Self-pay | Admitting: Dermatology

## 2021-05-23 NOTE — Progress Notes (Signed)
   Follow-Up Visit   Subjective  Kristy Hall is a 65 y.o. female who presents for the following: Follow-up (Follow up on PDT. Patient had no problems. She did not have much of a reaction. ).  Actinic keratoses Location:  Duration:  Quality:  Associated Signs/Symptoms: Modifying Factors: PDT Severity:  Timing: Context:   Objective  Well appearing patient in no apparent distress; mood and affect are within normal limits. Objective  Head - Anterior (Face): Although historically only mild reaction to the PDT I do believe there is more than 50% reduction in active lesions.  No uncovered skin cancers.  No lesions currently require freezing or biopsy.    A focused examination was performed including Head and neck. Relevant physical exam findings are noted in the Assessment and Plan.   Assessment & Plan    AK (actinic keratosis) Head - Anterior (Face)  Recheck 1 year      I, Lavonna Monarch, MD, have reviewed all documentation for this visit.  The documentation on 05/23/21 for the exam, diagnosis, procedures, and orders are all accurate and complete.

## 2021-07-06 DIAGNOSIS — F17210 Nicotine dependence, cigarettes, uncomplicated: Secondary | ICD-10-CM | POA: Diagnosis not present

## 2021-07-06 DIAGNOSIS — Z299 Encounter for prophylactic measures, unspecified: Secondary | ICD-10-CM | POA: Diagnosis not present

## 2021-07-06 DIAGNOSIS — I25119 Atherosclerotic heart disease of native coronary artery with unspecified angina pectoris: Secondary | ICD-10-CM | POA: Diagnosis not present

## 2021-07-06 DIAGNOSIS — I10 Essential (primary) hypertension: Secondary | ICD-10-CM | POA: Diagnosis not present

## 2021-07-06 DIAGNOSIS — J029 Acute pharyngitis, unspecified: Secondary | ICD-10-CM | POA: Diagnosis not present

## 2021-07-19 ENCOUNTER — Other Ambulatory Visit: Payer: Self-pay | Admitting: Internal Medicine

## 2021-07-19 DIAGNOSIS — Z139 Encounter for screening, unspecified: Secondary | ICD-10-CM

## 2021-07-22 DIAGNOSIS — I10 Essential (primary) hypertension: Secondary | ICD-10-CM | POA: Diagnosis not present

## 2021-07-22 DIAGNOSIS — F32A Depression, unspecified: Secondary | ICD-10-CM | POA: Diagnosis not present

## 2021-07-23 ENCOUNTER — Encounter: Payer: Self-pay | Admitting: Dermatology

## 2021-07-23 NOTE — Progress Notes (Signed)
   Follow-Up Visit   Subjective  Kristy Hall is a 65 y.o. female who presents for the following: Procedure (RIGHT OUTER SHIN).  Nonhealing crust Location:  Duration:  Quality:  Associated Signs/Symptoms: Modifying Factors:  Severity:  Timing: Context:   Objective  Well appearing patient in no apparent distress; mood and affect are within normal limits. Right Lower Leg - Anterior 1 cm thick pink crust, SCCA    A focused examination was performed including legs. Relevant physical exam findings are noted in the Assessment and Plan.   Assessment & Plan    Neoplasm of uncertain behavior of skin Right Lower Leg - Anterior  Skin / nail biopsy Type of biopsy: tangential   Informed consent: discussed and consent obtained   Timeout: patient name, date of birth, surgical site, and procedure verified   Procedure prep:  Patient was prepped and draped in usual sterile fashion (Non sterile) Prep type:  Chlorhexidine Anesthesia: the lesion was anesthetized in a standard fashion   Anesthetic:  1% lidocaine w/ epinephrine 1-100,000 local infiltration Instrument used: flexible razor blade   Hemostasis achieved with: pressure   Outcome: patient tolerated procedure well   Post-procedure details: sterile dressing applied and wound care instructions given    Specimen 1 - Surgical pathology Differential Diagnosis: BCC SCC Check Margins: No      I, Lavonna Monarch, MD, have reviewed all documentation for this visit.  The documentation on 07/23/21 for the exam, diagnosis, procedures, and orders are all accurate and complete.

## 2021-07-31 DIAGNOSIS — H6983 Other specified disorders of Eustachian tube, bilateral: Secondary | ICD-10-CM | POA: Diagnosis not present

## 2021-07-31 DIAGNOSIS — H903 Sensorineural hearing loss, bilateral: Secondary | ICD-10-CM | POA: Diagnosis not present

## 2021-08-28 DIAGNOSIS — H6983 Other specified disorders of Eustachian tube, bilateral: Secondary | ICD-10-CM | POA: Diagnosis not present

## 2021-09-08 DIAGNOSIS — M238X2 Other internal derangements of left knee: Secondary | ICD-10-CM | POA: Diagnosis not present

## 2021-09-08 DIAGNOSIS — M7989 Other specified soft tissue disorders: Secondary | ICD-10-CM | POA: Diagnosis not present

## 2021-09-08 DIAGNOSIS — M2392 Unspecified internal derangement of left knee: Secondary | ICD-10-CM | POA: Diagnosis not present

## 2021-09-08 DIAGNOSIS — S8392XA Sprain of unspecified site of left knee, initial encounter: Secondary | ICD-10-CM | POA: Diagnosis not present

## 2021-09-08 DIAGNOSIS — M25562 Pain in left knee: Secondary | ICD-10-CM | POA: Diagnosis not present

## 2021-09-21 DIAGNOSIS — I10 Essential (primary) hypertension: Secondary | ICD-10-CM | POA: Diagnosis not present

## 2021-09-21 DIAGNOSIS — F32A Depression, unspecified: Secondary | ICD-10-CM | POA: Diagnosis not present

## 2021-09-25 DIAGNOSIS — F17210 Nicotine dependence, cigarettes, uncomplicated: Secondary | ICD-10-CM | POA: Diagnosis not present

## 2021-09-25 DIAGNOSIS — E7800 Pure hypercholesterolemia, unspecified: Secondary | ICD-10-CM | POA: Diagnosis not present

## 2021-09-25 DIAGNOSIS — Z299 Encounter for prophylactic measures, unspecified: Secondary | ICD-10-CM | POA: Diagnosis not present

## 2021-09-25 DIAGNOSIS — M1710 Unilateral primary osteoarthritis, unspecified knee: Secondary | ICD-10-CM | POA: Diagnosis not present

## 2021-09-25 DIAGNOSIS — I10 Essential (primary) hypertension: Secondary | ICD-10-CM | POA: Diagnosis not present

## 2021-09-25 DIAGNOSIS — I25118 Atherosclerotic heart disease of native coronary artery with other forms of angina pectoris: Secondary | ICD-10-CM | POA: Diagnosis not present

## 2021-09-25 DIAGNOSIS — Z2821 Immunization not carried out because of patient refusal: Secondary | ICD-10-CM | POA: Diagnosis not present

## 2021-10-09 DIAGNOSIS — Z299 Encounter for prophylactic measures, unspecified: Secondary | ICD-10-CM | POA: Diagnosis not present

## 2021-10-09 DIAGNOSIS — F17210 Nicotine dependence, cigarettes, uncomplicated: Secondary | ICD-10-CM | POA: Diagnosis not present

## 2021-10-09 DIAGNOSIS — M1712 Unilateral primary osteoarthritis, left knee: Secondary | ICD-10-CM | POA: Diagnosis not present

## 2021-10-09 DIAGNOSIS — I10 Essential (primary) hypertension: Secondary | ICD-10-CM | POA: Diagnosis not present

## 2021-10-22 DIAGNOSIS — I10 Essential (primary) hypertension: Secondary | ICD-10-CM | POA: Diagnosis not present

## 2021-10-22 DIAGNOSIS — F32A Depression, unspecified: Secondary | ICD-10-CM | POA: Diagnosis not present

## 2021-10-25 DIAGNOSIS — R059 Cough, unspecified: Secondary | ICD-10-CM | POA: Diagnosis not present

## 2021-10-25 DIAGNOSIS — J00 Acute nasopharyngitis [common cold]: Secondary | ICD-10-CM | POA: Diagnosis not present

## 2021-10-25 DIAGNOSIS — Z20822 Contact with and (suspected) exposure to covid-19: Secondary | ICD-10-CM | POA: Diagnosis not present

## 2021-11-04 ENCOUNTER — Other Ambulatory Visit: Payer: Self-pay | Admitting: Cardiology

## 2021-11-04 DIAGNOSIS — E782 Mixed hyperlipidemia: Secondary | ICD-10-CM

## 2021-11-12 ENCOUNTER — Ambulatory Visit: Payer: Medicare Other | Admitting: Dermatology

## 2021-11-26 ENCOUNTER — Ambulatory Visit
Admission: RE | Admit: 2021-11-26 | Discharge: 2021-11-26 | Disposition: A | Discharge status: Home / Self Care | Payer: Medicare Other | Source: Ambulatory Visit | Attending: Internal Medicine | Admitting: Internal Medicine

## 2021-11-26 ENCOUNTER — Other Ambulatory Visit: Payer: Self-pay

## 2021-11-26 DIAGNOSIS — Z139 Encounter for screening, unspecified: Secondary | ICD-10-CM

## 2021-11-26 DIAGNOSIS — Z1231 Encounter for screening mammogram for malignant neoplasm of breast: Secondary | ICD-10-CM | POA: Diagnosis not present

## 2021-11-30 DIAGNOSIS — M25562 Pain in left knee: Secondary | ICD-10-CM | POA: Diagnosis not present

## 2021-12-05 ENCOUNTER — Other Ambulatory Visit: Payer: Self-pay | Admitting: Internal Medicine

## 2021-12-05 DIAGNOSIS — R928 Other abnormal and inconclusive findings on diagnostic imaging of breast: Secondary | ICD-10-CM

## 2021-12-07 DIAGNOSIS — M25562 Pain in left knee: Secondary | ICD-10-CM | POA: Diagnosis not present

## 2021-12-14 DIAGNOSIS — S83242A Other tear of medial meniscus, current injury, left knee, initial encounter: Secondary | ICD-10-CM | POA: Diagnosis not present

## 2021-12-19 DIAGNOSIS — E7800 Pure hypercholesterolemia, unspecified: Secondary | ICD-10-CM | POA: Diagnosis not present

## 2021-12-19 DIAGNOSIS — Z79899 Other long term (current) drug therapy: Secondary | ICD-10-CM | POA: Diagnosis not present

## 2021-12-19 DIAGNOSIS — Z299 Encounter for prophylactic measures, unspecified: Secondary | ICD-10-CM | POA: Diagnosis not present

## 2021-12-19 DIAGNOSIS — F17210 Nicotine dependence, cigarettes, uncomplicated: Secondary | ICD-10-CM | POA: Diagnosis not present

## 2021-12-19 DIAGNOSIS — I10 Essential (primary) hypertension: Secondary | ICD-10-CM | POA: Diagnosis not present

## 2021-12-19 DIAGNOSIS — Z7189 Other specified counseling: Secondary | ICD-10-CM | POA: Diagnosis not present

## 2021-12-19 DIAGNOSIS — R5383 Other fatigue: Secondary | ICD-10-CM | POA: Diagnosis not present

## 2021-12-19 DIAGNOSIS — Z2821 Immunization not carried out because of patient refusal: Secondary | ICD-10-CM | POA: Diagnosis not present

## 2021-12-19 DIAGNOSIS — Z0000 Encounter for general adult medical examination without abnormal findings: Secondary | ICD-10-CM | POA: Diagnosis not present

## 2021-12-21 ENCOUNTER — Ambulatory Visit
Admission: RE | Admit: 2021-12-21 | Discharge: 2021-12-21 | Disposition: A | Discharge status: Home / Self Care | Payer: Medicare Other | Source: Ambulatory Visit | Attending: Internal Medicine | Admitting: Internal Medicine

## 2021-12-21 ENCOUNTER — Other Ambulatory Visit: Payer: Self-pay | Admitting: Internal Medicine

## 2021-12-21 DIAGNOSIS — R928 Other abnormal and inconclusive findings on diagnostic imaging of breast: Secondary | ICD-10-CM

## 2021-12-21 DIAGNOSIS — N6489 Other specified disorders of breast: Secondary | ICD-10-CM

## 2021-12-21 DIAGNOSIS — R922 Inconclusive mammogram: Secondary | ICD-10-CM | POA: Diagnosis not present

## 2021-12-26 ENCOUNTER — Encounter: Payer: Self-pay | Admitting: Cardiology

## 2021-12-26 ENCOUNTER — Ambulatory Visit: Payer: Medicare Other | Admitting: Cardiology

## 2021-12-26 ENCOUNTER — Other Ambulatory Visit: Payer: Self-pay

## 2021-12-26 VITALS — BP 148/79 | HR 62 | Temp 97.2°F | Resp 17 | Ht 60.0 in | Wt 136.2 lb

## 2021-12-26 DIAGNOSIS — F17200 Nicotine dependence, unspecified, uncomplicated: Secondary | ICD-10-CM | POA: Diagnosis not present

## 2021-12-26 DIAGNOSIS — Z01810 Encounter for preprocedural cardiovascular examination: Secondary | ICD-10-CM | POA: Diagnosis not present

## 2021-12-26 DIAGNOSIS — E782 Mixed hyperlipidemia: Secondary | ICD-10-CM

## 2021-12-26 DIAGNOSIS — I10 Essential (primary) hypertension: Secondary | ICD-10-CM | POA: Diagnosis not present

## 2021-12-26 DIAGNOSIS — I25118 Atherosclerotic heart disease of native coronary artery with other forms of angina pectoris: Secondary | ICD-10-CM | POA: Insufficient documentation

## 2021-12-26 MED ORDER — METOPROLOL SUCCINATE ER 50 MG PO TB24: 50.0000 mg | ORAL_TABLET | Freq: Every day | ORAL | 3 refills | Status: AC

## 2021-12-26 NOTE — Progress Notes (Signed)
Primary Physician/Referring:  Monico Blitz, MD  Patient ID: Kristy Hall, female    DOB: 1956/10/15, 66 y.o.   MRN: [id number]  Chief Complaint  Patient presents with   Coronary Artery Disease    1 year   Hypertension   Hyperlipidemia   HPI:    Kristy Hall  is a 66 y.o. 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  This annual visit, she has developed tear in the meniscus left knee and is scheduled for left knee arthroscopy on 01/15/2022.  No symptoms of angina, she has not had any significant dyspnea, she has noticed occasional leg edema.  Denies symptoms of claudication or TIA.  She is taking all her medications as prescribed.  Past Medical History:  Diagnosis Date   Hypertension    Mixed hyperlipidemia 11/05/2019   SCCA (squamous cell carcinoma) of skin 10/18/2020   Right Lower Leg-Anterior (well diff)    Tobacco use disorder 11/05/2019   Past Surgical History:  Procedure Laterality Date   ABDOMINAL HYSTERECTOMY     BLADDER SUSPENSION     CARPAL TUNNEL RELEASE Bilateral    CORONARY STENT INTERVENTION N/A 11/05/2019   Procedure: CORONARY STENT INTERVENTION;  Surgeon: Adrian Prows, MD;  Location: [facility];  Service: Cardiovascular;  Laterality: N/A;   HEMORROIDECTOMY     LEFT HEART CATH AND CORONARY ANGIOGRAPHY N/A 11/05/2019   Procedure: LEFT HEART CATH AND CORONARY ANGIOGRAPHY;  Surgeon: Adrian Prows, MD;  Location: [facility];  Service: Cardiovascular;  Laterality: N/A;    Family History  Problem Relation Age of Onset   COPD Mother    Basal cell carcinoma Mother    COPD Brother    Breast cancer Neg Hx     Social History   Tobacco Use   Smoking status: Former    Packs/day: 0.50    Years: 15.00    Pack years: 7.50    Types: Cigarettes    Quit  date: 04/2020    Years since quitting: 1.6   Smokeless tobacco: Never  Substance Use Topics   Alcohol use: Yes    Comment: occasional   Marital Status: Widowed  ROS  Review of Systems  Cardiovascular:  Negative for chest pain, dyspnea on exertion and leg swelling.  Musculoskeletal:  Positive for arthritis (left knee).  Gastrointestinal:  Negative for melena.  Objective  Blood pressure (!) 148/79, pulse 62, temperature (!) 97.2 F (36.2 C), resp. rate 17, height 5' (1.524 m), weight 136 lb 3.2 oz (61.8 kg), SpO2 98 %.  Vitals with BMI 12/26/2021 12/26/2020 05/25/2020  Height '5\' 0"'  '5\' 0"'  '5\' 0"'   Weight 136 lbs 3 oz 137 lbs 138 lbs  BMI 26.6 76.54 65.03  Systolic 546 568 127  Diastolic 79 76 77  Pulse 62 62 64      Physical Exam Constitutional:      Comments: Moderately built and well-nourished, appears older than stated age, in no acute distress.  Cardiovascular:     Rate and Rhythm: Normal rate and regular rhythm.     Pulses: Intact distal pulses.          Carotid pulses are  on the right side with bruit and  on the left side with bruit.    Heart sounds: Normal heart sounds. No murmur heard.   No gallop.  Comments: No leg edema, no JVD. Pulmonary:     Effort: Pulmonary effort is normal.     Breath sounds: Normal breath sounds.  Abdominal:     General: Bowel sounds are normal.     Palpations: Abdomen is soft.   Laboratory examination:   No results for input(s): NA, K, CL, CO2, GLUCOSE, BUN, CREATININE, CALCIUM, GFRNONAA, GFRAA in the last 8760 hours. CrCl cannot be calculated (Patient's most recent lab result is older than the maximum 21 days allowed.).  CMP Latest Ref Rng & Units 11/06/2019  Glucose 70 - 99 mg/dL 94  BUN 8 - 23 mg/dL 12  Creatinine 0.44 - 1.00 mg/dL 0.79  Sodium 135 - 145 mmol/L 140  Potassium 3.5 - 5.1 mmol/L 3.8  Chloride 98 - 111 mmol/L 107  CO2 22 - 32 mmol/L 23  Calcium 8.9 - 10.3 mg/dL 9.1   CBC Latest Ref Rng & Units 11/06/2019  WBC 4.0 -  10.5 K/uL 10.2  Hemoglobin 12.0 - 15.0 g/dL 12.6  Hematocrit 36.0 - 46.0 % 37.8  Platelets 150 - 400 K/uL 230   Lipid Panel   No results found for: CHOL, TRIG, HDL, CHOLHDL, VLDL, LDLCALC, LDLDIRECT HEMOGLOBIN A1C No results found for: HGBA1C, MPG TSH No results for input(s): TSH in the last 8760 hours.   External Labs:   Lab 04/16/2020:  Hb 12.0/HCT 37.1, platelets 224.  BUN 11, creatinine 0.57, EGFR >60 mL.  Potassium 3.1, sodium 141.  11/05/2019:   Sodium 140, potassium 3.3, BUN 17, creatinine 0.56, EGFR greater than 60 mL, CMP otherwise normal.  D-dimer negative at 0.41. Hb12.9/HCT 39.0, platelets 267.  Normal indicis.  Serum troponins serial: 0.04, 0.03, 0.06, 0.05, abnormal, normal <0.01.   Total cholesterol 246, triglycerides 199, HDL 54, LDL 152.   Medications and allergies  No Known Allergies  Current Outpatient Medications on File Prior to Visit  Medication Sig Dispense Refill   acetaminophen (TYLENOL) 325 MG tablet Take 2 tablets (650 mg total) by mouth every 4 (four) hours as needed for headache or mild pain.     Ascorbic Acid (VITAMIN C PO) Take by mouth.     aspirin (ASPIRIN CHILDRENS) 81 MG chewable tablet Chew 1 tablet (81 mg total) by mouth daily.     atorvastatin (LIPITOR) 80 MG tablet TAKE 1 TABLET BY MOUTH ONCE DAILY AT 6 PM 90 tablet 3   LORazepam (ATIVAN) 0.5 MG tablet Take 0.5 mg by mouth as needed.      losartan (COZAAR) 100 MG tablet TAKE 1 TABLET BY MOUTH  DAILY AFTER SUPPER 90 tablet 3   VITAMIN D PO Take 1,000 mg by mouth.     No current facility-administered medications on file prior to visit.    Radiology:  No results found.   Cardiac Studies:   Left heart catheterization 11/05/2019: LV: Normal LV systolic function without wall motion abnormality.  Normal LVEDP. RCA: Dominant.  Proximal 40% stenosis.  Mid to distal 20% stenosis. Left main: Normal. LAD: Large caliber vessel, diffusely diseased mild to moderately.  No high-grade  stenosis evident.  Gives origin to a moderate sized D1 and several small D. Ramus intermediate: Small sized vessel, tortuous, mild diffuse disease. Circumflex: Large area of distribution, gives origin to moderate sized OM1, proximal circumflex has a 95% ulcerated stenosis.  SP 2.5 x 18 mm resolute Onyx DES reduced to 0% with TIMI-3 to TIMI-3 flow.  Carotid artery duplex  12/09/2019:  Stenosis in the right external carotid artery (<50%).  Minimal  stenosis in the left internal carotid artery (minimal).  Stenosis in the left external carotid artery (<50%).  Antegrade right vertebral artery flow. Antegrade left vertebral artery flow.  Follow up  studies when clinically indicated.  Echocardiogram 12/09/2019:  Left ventricle cavity is normal in size. Mild concentric hypertrophy of the left ventricle. Normal global wall motion. Normal LV systolic function with EF 55%. Normal diastolic filling pattern. Calculated EF 55%.  Moderate (Grade II) mitral regurgitation.  Mild to moderate tricuspid regurgitation. Estimated pulmonary artery systolic pressure is 25 mmHg.  Insignificant pericardial effusion.  Exercise tetrofosmin stress test  04/18/2020:([facility], [place], [place]): No reversible ischemia or infarction.  Mild apical thinning.  LVEF 60%.  Low risk.  EKG:  EKG 12/26/2020: Normal sinus rhythm at rate of 62 bpm, normal axis.  Incomplete right bundle branch block.  No evidence of ischemia otherwise normal EKG.  No change from 12/26/2020.  Assessment     ICD-10-CM   1. Coronary artery disease of native artery of native heart with stable angina pectoris (HCC)  I25.118 metoprolol succinate (TOPROL-XL) 50 MG 24 hr tablet    2. Pre-operative cardiovascular examination  Z01.810     3. Primary hypertension  I10 EKG 12-Lead    metoprolol succinate (TOPROL-XL) 50 MG 24 hr tablet    4. Mixed hyperlipidemia  E78.2     5. Tobacco use disorder  F17.200       Meds ordered this encounter   Medications   metoprolol succinate (TOPROL-XL) 50 MG 24 hr tablet    Sig: Take 1 tablet (50 mg total) by mouth daily.    Dispense:  90 tablet    Refill:  3    Requesting 1 year supply   Medications Discontinued During This Encounter  Medication Reason   mupirocin ointment (BACTROBAN) 2 %    nitroGLYCERIN (NITROSTAT) 0.4 MG SL tablet    omeprazole (PRILOSEC) 20 MG capsule    metoprolol succinate (TOPROL-XL) 25 MG 24 hr tablet Reorder    Orders Placed This Encounter  Procedures   EKG 12-Lead     Recommendations:   Kristy Hall  is a 66 y.o. 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  She was evaluated for chest pain on 04/17/2020 at [facility] in [place], and had negative nuclear stress test.    She is scheduled for left knee arthroscopy on 01/15/2021 by Dr. Hart Robinsons, I sent a preoperative cardiac restratification letter to him, I would prefer her to be on aspirin 81 mg periprocedural clinic.  Unfortunately she is still smoking.  I again discussed with her regarding the risks associated with progression of coronary artery disease.  She has prominent carotid bruit but unfortunately carotid duplex had revealed only external carotid artery stenosis.  Lipids are under management by PCP, goal LDL <70 and prefer to be closer to 55.  Blood pressures previously well controlled, elevated today, probably related to pain in her left knee, however in view of significant ongoing cardiovascular risks, I will increase metoprolol succinate from 25 mg to 50 mg daily.  She will continue to monitor her blood pressure, goal blood pressure 130/80 mmHg.  I will see her back again in a year.   Adrian Prows, MD, [facility] 12/26/2021, 10:44 AM Office: [phone] Pager: [phone]

## 2022-01-15 ENCOUNTER — Other Ambulatory Visit: Payer: Medicare Other

## 2022-01-15 DIAGNOSIS — M11262 Other chondrocalcinosis, left knee: Secondary | ICD-10-CM | POA: Diagnosis not present

## 2022-01-15 DIAGNOSIS — S83232A Complex tear of medial meniscus, current injury, left knee, initial encounter: Secondary | ICD-10-CM | POA: Diagnosis not present

## 2022-01-15 DIAGNOSIS — G8918 Other acute postprocedural pain: Secondary | ICD-10-CM | POA: Diagnosis not present

## 2022-01-15 DIAGNOSIS — M94262 Chondromalacia, left knee: Secondary | ICD-10-CM | POA: Diagnosis not present

## 2022-01-22 DIAGNOSIS — M25562 Pain in left knee: Secondary | ICD-10-CM | POA: Diagnosis not present

## 2022-01-22 DIAGNOSIS — I10 Essential (primary) hypertension: Secondary | ICD-10-CM | POA: Diagnosis not present

## 2022-01-22 DIAGNOSIS — E782 Mixed hyperlipidemia: Secondary | ICD-10-CM | POA: Diagnosis not present

## 2022-01-25 DIAGNOSIS — M25562 Pain in left knee: Secondary | ICD-10-CM | POA: Diagnosis not present

## 2022-01-28 ENCOUNTER — Other Ambulatory Visit: Payer: Self-pay | Admitting: Cardiology

## 2022-01-28 DIAGNOSIS — I25118 Atherosclerotic heart disease of native coronary artery with other forms of angina pectoris: Secondary | ICD-10-CM

## 2022-01-28 DIAGNOSIS — I10 Essential (primary) hypertension: Secondary | ICD-10-CM

## 2022-01-29 DIAGNOSIS — M25562 Pain in left knee: Secondary | ICD-10-CM | POA: Diagnosis not present

## 2022-01-31 DIAGNOSIS — M79675 Pain in left toe(s): Secondary | ICD-10-CM | POA: Diagnosis not present

## 2022-01-31 DIAGNOSIS — M79671 Pain in right foot: Secondary | ICD-10-CM | POA: Diagnosis not present

## 2022-01-31 DIAGNOSIS — M79674 Pain in right toe(s): Secondary | ICD-10-CM | POA: Diagnosis not present

## 2022-01-31 DIAGNOSIS — M79672 Pain in left foot: Secondary | ICD-10-CM | POA: Diagnosis not present

## 2022-01-31 DIAGNOSIS — B351 Tinea unguium: Secondary | ICD-10-CM | POA: Diagnosis not present

## 2022-03-21 DIAGNOSIS — E782 Mixed hyperlipidemia: Secondary | ICD-10-CM | POA: Diagnosis not present

## 2022-03-21 DIAGNOSIS — I10 Essential (primary) hypertension: Secondary | ICD-10-CM | POA: Diagnosis not present

## 2022-05-22 DIAGNOSIS — F32A Depression, unspecified: Secondary | ICD-10-CM | POA: Diagnosis not present

## 2022-05-22 DIAGNOSIS — I10 Essential (primary) hypertension: Secondary | ICD-10-CM | POA: Diagnosis not present

## 2022-05-29 ENCOUNTER — Ambulatory Visit: Payer: Medicare Other | Admitting: Dermatology

## 2022-05-29 ENCOUNTER — Encounter: Payer: Self-pay | Admitting: Dermatology

## 2022-05-29 DIAGNOSIS — D0439 Carcinoma in situ of skin of other parts of face: Secondary | ICD-10-CM

## 2022-05-29 DIAGNOSIS — L570 Actinic keratosis: Secondary | ICD-10-CM

## 2022-05-29 DIAGNOSIS — D485 Neoplasm of uncertain behavior of skin: Secondary | ICD-10-CM

## 2022-05-29 NOTE — Patient Instructions (Signed)

## 2022-06-05 ENCOUNTER — Telehealth: Payer: Self-pay | Admitting: Dermatology

## 2022-06-05 DIAGNOSIS — I10 Essential (primary) hypertension: Secondary | ICD-10-CM | POA: Diagnosis not present

## 2022-06-05 DIAGNOSIS — Z299 Encounter for prophylactic measures, unspecified: Secondary | ICD-10-CM | POA: Diagnosis not present

## 2022-06-05 DIAGNOSIS — M542 Cervicalgia: Secondary | ICD-10-CM | POA: Diagnosis not present

## 2022-06-05 DIAGNOSIS — F17210 Nicotine dependence, cigarettes, uncomplicated: Secondary | ICD-10-CM | POA: Diagnosis not present

## 2022-06-05 DIAGNOSIS — R059 Cough, unspecified: Secondary | ICD-10-CM | POA: Diagnosis not present

## 2022-06-05 NOTE — Telephone Encounter (Signed)
Results, ST 

## 2022-06-06 ENCOUNTER — Encounter: Payer: Self-pay | Admitting: Dermatology

## 2022-06-06 NOTE — Telephone Encounter (Signed)
Phone call to patient with her pathology results. Patient aware of results.  

## 2022-06-12 DIAGNOSIS — R059 Cough, unspecified: Secondary | ICD-10-CM | POA: Diagnosis not present

## 2022-06-19 ENCOUNTER — Encounter: Payer: Self-pay | Admitting: Dermatology

## 2022-06-19 NOTE — Progress Notes (Signed)
   Follow-Up Visit   Subjective  Kristy Hall is a 66 y.o. female who presents for the following: Skin Problem (Patient here today for lesions on her face and both arms x several months no bleeding. ).  Persistent crusts on face and arms, nose lesion growing Location:  Duration:  Quality:  Associated Signs/Symptoms: Modifying Factors:  Severity:  Timing: Context:   Objective  Well appearing patient in no apparent distress; mood and affect are within normal limits. Left Dorsal Hand, Left Forearm - Posterior (2), Right Forearm - Anterior, Right Forearm - Posterior, Right Upper Cutaneous Lip Half dozen gritty 2 to 4 mm pink crusts  Mid Supratip of Nose         A focused examination was performed including head, neck, arms, back. Relevant physical exam findings are noted in the Assessment and Plan.   Assessment & Plan    AK (actinic keratosis) (6) Right Forearm - Anterior; Left Forearm - Posterior (2); Right Forearm - Posterior; Left Dorsal Hand; Right Upper Cutaneous Lip  Destruction of lesion - Left Dorsal Hand, Left Forearm - Posterior, Right Forearm - Anterior, Right Forearm - Posterior, Right Upper Cutaneous Lip Complexity: simple   Destruction method: cryotherapy   Informed consent: discussed and consent obtained   Timeout:  patient name, date of birth, surgical site, and procedure verified Lesion destroyed using liquid nitrogen: Yes   Cryotherapy cycles:  3 Outcome: patient tolerated procedure well with no complications    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  Skin / nail biopsy - Mid Supratip of Nose Type of biopsy: tangential   Informed consent: discussed and consent obtained   Timeout: patient name, date of birth, surgical site, and procedure verified   Procedure prep:  Patient was prepped and draped in usual sterile fashion (Non sterile) Prep type:  Chlorhexidine Anesthesia: the lesion was anesthetized in a standard fashion    Anesthetic:  1% lidocaine w/ epinephrine 1-100,000 local infiltration Instrument used: flexible razor blade   Outcome: patient tolerated procedure well   Post-procedure details: wound care instructions given    Destruction of lesion - Mid Supratip of Nose Complexity: simple   Destruction method: electrodesiccation and curettage   Informed consent: discussed and consent obtained   Timeout:  patient name, date of birth, surgical site, and procedure verified Anesthesia: the lesion was anesthetized in a standard fashion   Anesthetic:  1% lidocaine w/ epinephrine 1-100,000 local infiltration Curettage performed in three different directions: Yes   Curettage cycles:  3 Lesion length (cm):  0.9 Lesion width (cm):  0.9 Margin per side (cm):  0 Final wound size (cm):  0.9 Hemostasis achieved with:  aluminum chloride Outcome: patient tolerated procedure well with no complications   Post-procedure details: wound care instructions given    Specimen 1 - Surgical pathology Differential Diagnosis: bcc vs [id number]  Check Margins: No      I, Lavonna Monarch, MD, have reviewed all documentation for this visit.  The documentation on 06/19/22 for the exam, diagnosis, procedures, and orders are all accurate and complete.

## 2022-06-24 ENCOUNTER — Ambulatory Visit: Payer: Medicare Other

## 2022-06-24 ENCOUNTER — Ambulatory Visit
Admission: RE | Admit: 2022-06-24 | Discharge: 2022-06-24 | Disposition: A | Discharge status: Home / Self Care | Payer: Medicare Other | Source: Ambulatory Visit | Attending: Internal Medicine | Admitting: Internal Medicine

## 2022-06-24 ENCOUNTER — Other Ambulatory Visit: Payer: Self-pay | Admitting: Internal Medicine

## 2022-06-24 DIAGNOSIS — I2510 Atherosclerotic heart disease of native coronary artery without angina pectoris: Secondary | ICD-10-CM | POA: Diagnosis not present

## 2022-06-24 DIAGNOSIS — I700 Atherosclerosis of aorta: Secondary | ICD-10-CM | POA: Diagnosis not present

## 2022-06-24 DIAGNOSIS — N6489 Other specified disorders of breast: Secondary | ICD-10-CM

## 2022-06-24 DIAGNOSIS — R9389 Abnormal findings on diagnostic imaging of other specified body structures: Secondary | ICD-10-CM | POA: Diagnosis not present

## 2022-06-24 DIAGNOSIS — R918 Other nonspecific abnormal finding of lung field: Secondary | ICD-10-CM | POA: Diagnosis not present

## 2022-06-24 DIAGNOSIS — R109 Unspecified abdominal pain: Secondary | ICD-10-CM | POA: Diagnosis not present

## 2022-06-24 DIAGNOSIS — R911 Solitary pulmonary nodule: Secondary | ICD-10-CM | POA: Diagnosis not present

## 2022-06-24 DIAGNOSIS — R922 Inconclusive mammogram: Secondary | ICD-10-CM | POA: Diagnosis not present

## 2022-06-26 DIAGNOSIS — I25118 Atherosclerotic heart disease of native coronary artery with other forms of angina pectoris: Secondary | ICD-10-CM | POA: Diagnosis not present

## 2022-06-26 DIAGNOSIS — Z299 Encounter for prophylactic measures, unspecified: Secondary | ICD-10-CM | POA: Diagnosis not present

## 2022-06-26 DIAGNOSIS — I700 Atherosclerosis of aorta: Secondary | ICD-10-CM | POA: Diagnosis not present

## 2022-06-26 DIAGNOSIS — I10 Essential (primary) hypertension: Secondary | ICD-10-CM | POA: Diagnosis not present

## 2022-09-17 DIAGNOSIS — M79672 Pain in left foot: Secondary | ICD-10-CM | POA: Diagnosis not present

## 2022-09-17 DIAGNOSIS — M25562 Pain in left knee: Secondary | ICD-10-CM | POA: Diagnosis not present

## 2022-09-18 ENCOUNTER — Other Ambulatory Visit: Payer: Self-pay | Admitting: Cardiology

## 2022-09-18 DIAGNOSIS — E782 Mixed hyperlipidemia: Secondary | ICD-10-CM

## 2022-09-23 DIAGNOSIS — L570 Actinic keratosis: Secondary | ICD-10-CM | POA: Diagnosis not present

## 2022-09-23 DIAGNOSIS — D0439 Carcinoma in situ of skin of other parts of face: Secondary | ICD-10-CM | POA: Diagnosis not present

## 2022-09-23 DIAGNOSIS — X32XXXA Exposure to sunlight, initial encounter: Secondary | ICD-10-CM | POA: Diagnosis not present

## 2022-11-04 DIAGNOSIS — L570 Actinic keratosis: Secondary | ICD-10-CM | POA: Diagnosis not present

## 2022-11-04 DIAGNOSIS — Z85828 Personal history of other malignant neoplasm of skin: Secondary | ICD-10-CM | POA: Diagnosis not present

## 2022-11-04 DIAGNOSIS — Z08 Encounter for follow-up examination after completed treatment for malignant neoplasm: Secondary | ICD-10-CM | POA: Diagnosis not present

## 2022-11-04 DIAGNOSIS — X32XXXD Exposure to sunlight, subsequent encounter: Secondary | ICD-10-CM | POA: Diagnosis not present

## 2022-11-11 IMAGING — MG MM DIGITAL SCREENING BILAT W/ TOMO AND CAD
8 series · 9 of 24 positions shown · non-contrast
Comparison: None.

CLINICAL DATA: Screening.

EXAM:
DIGITAL SCREENING BILATERAL MAMMOGRAM WITH TOMOSYNTHESIS AND CAD
TECHNIQUE: Bilateral screening digital craniocaudal and mediolateral oblique
mammograms were obtained. Bilateral screening digital breast
tomosynthesis was performed. The images were evaluated with
computer-aided detection.

[L MLO synth-2D]
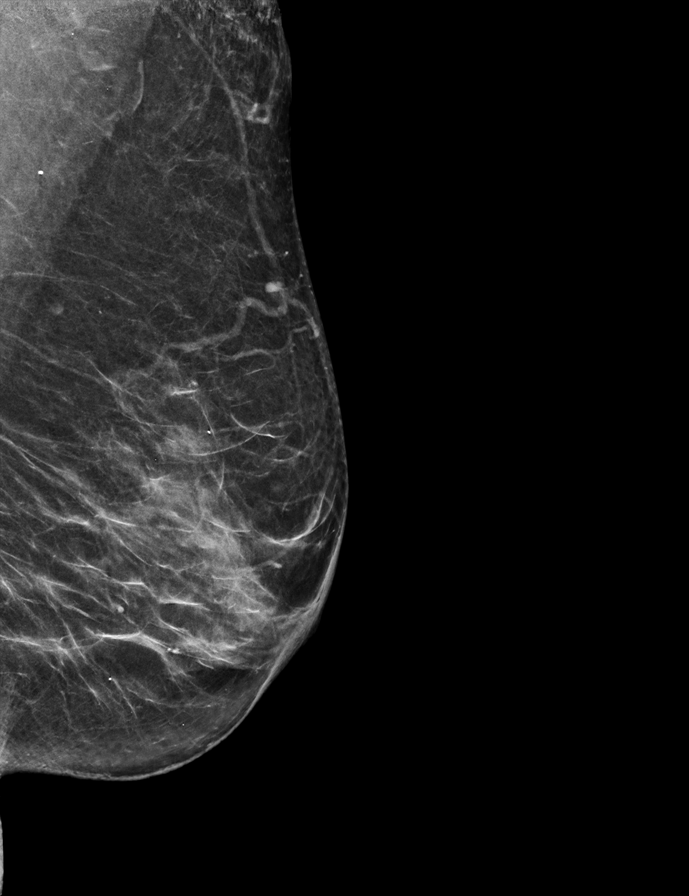

[R CC synth-2D]
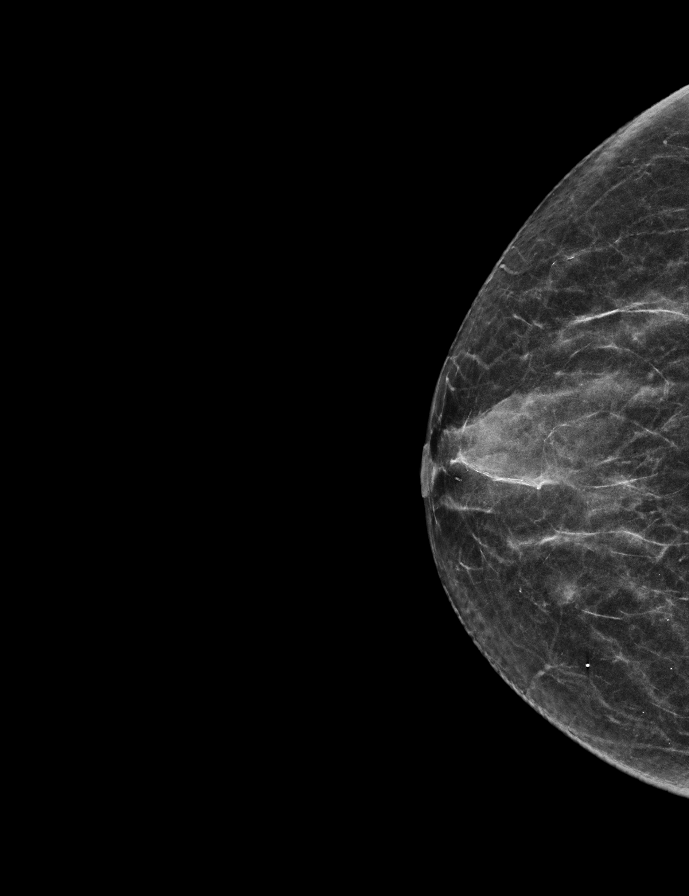

[L CC synth-2D]
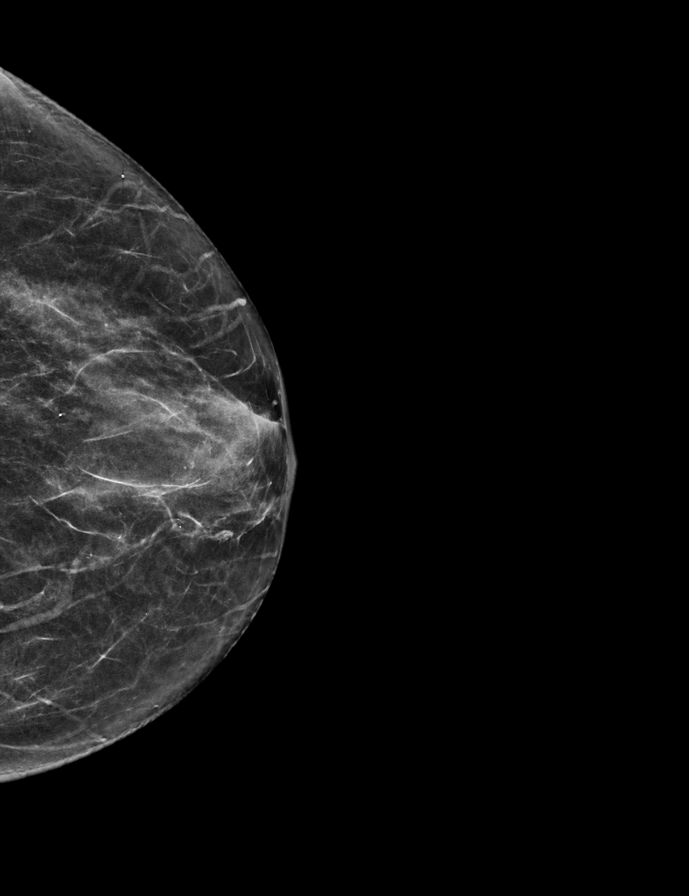

[R MLO synth-2D]
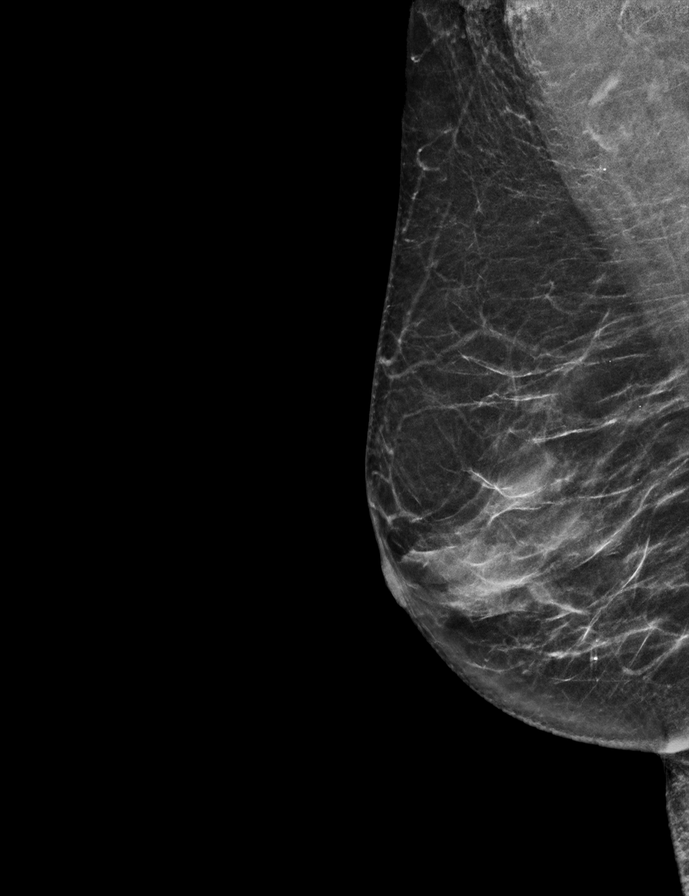

[L CC tomo · 2 of 55 frames shown]
[frame 18/55]
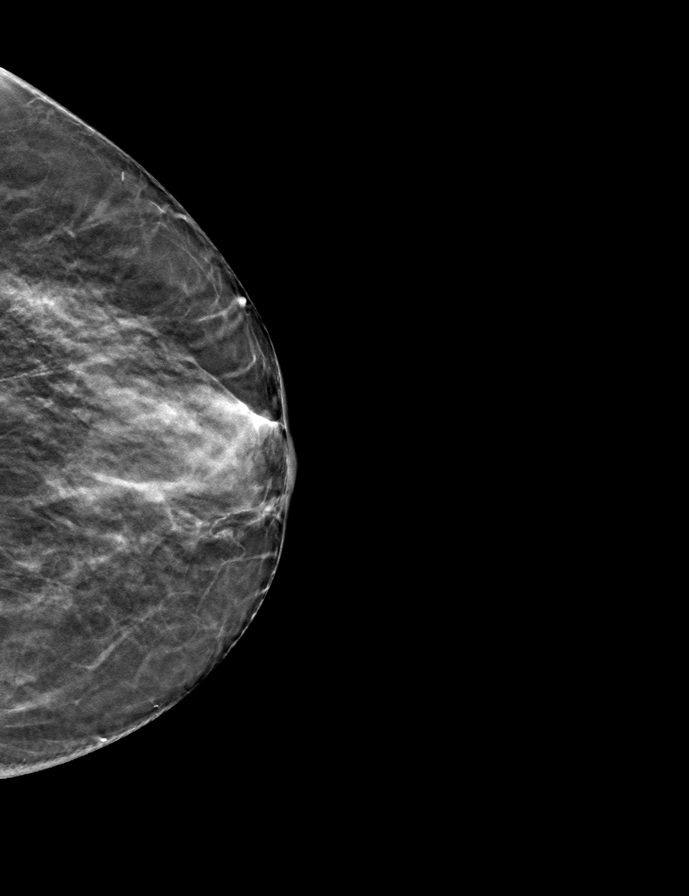
[frame 28/55]
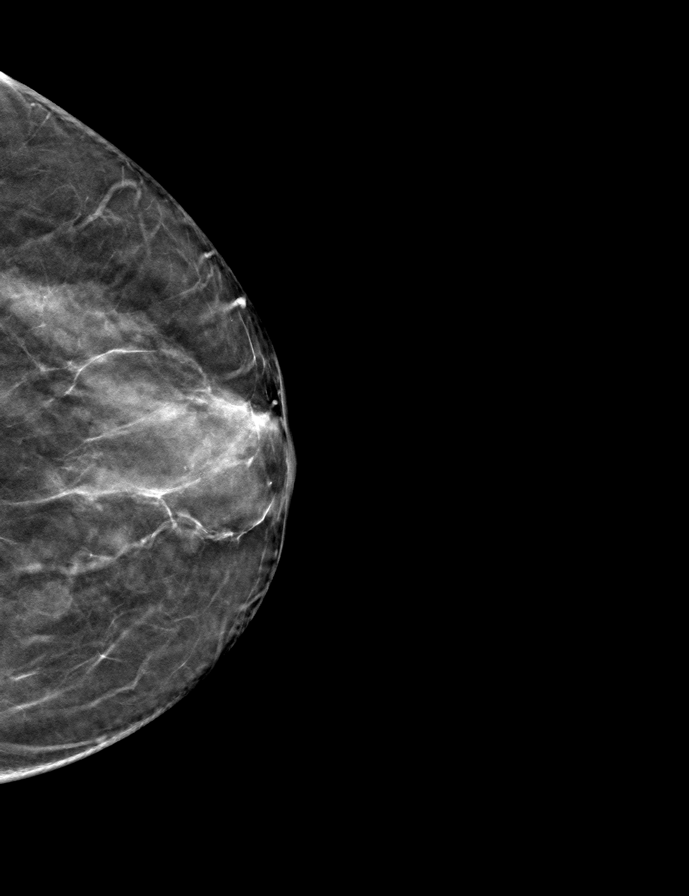

[R CC tomo · tomo slice 26/51.0]
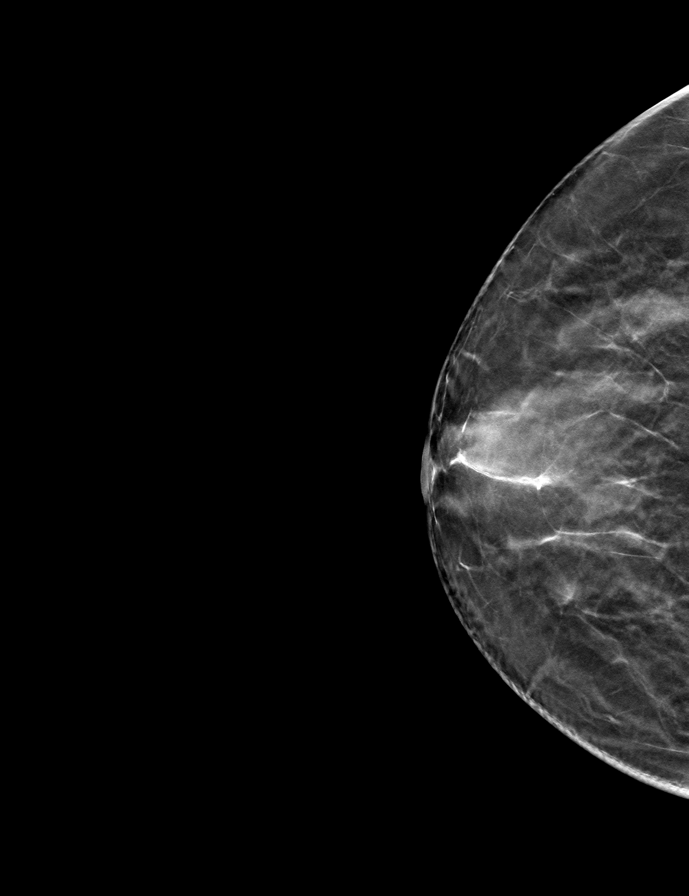

[R MLO tomo · tomo slice 31/61.0]
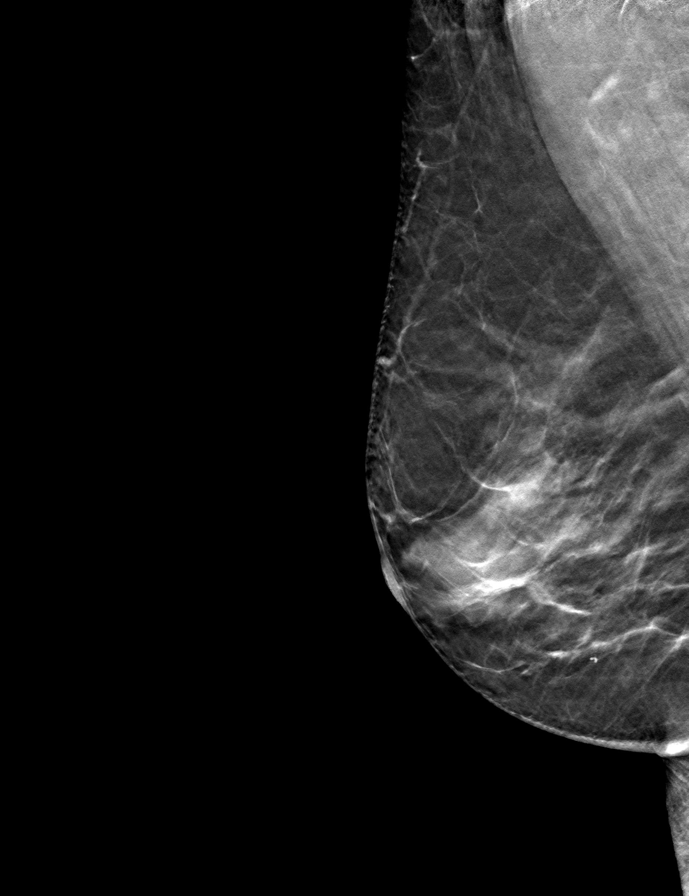

[L MLO tomo · tomo slice 32/63.0]
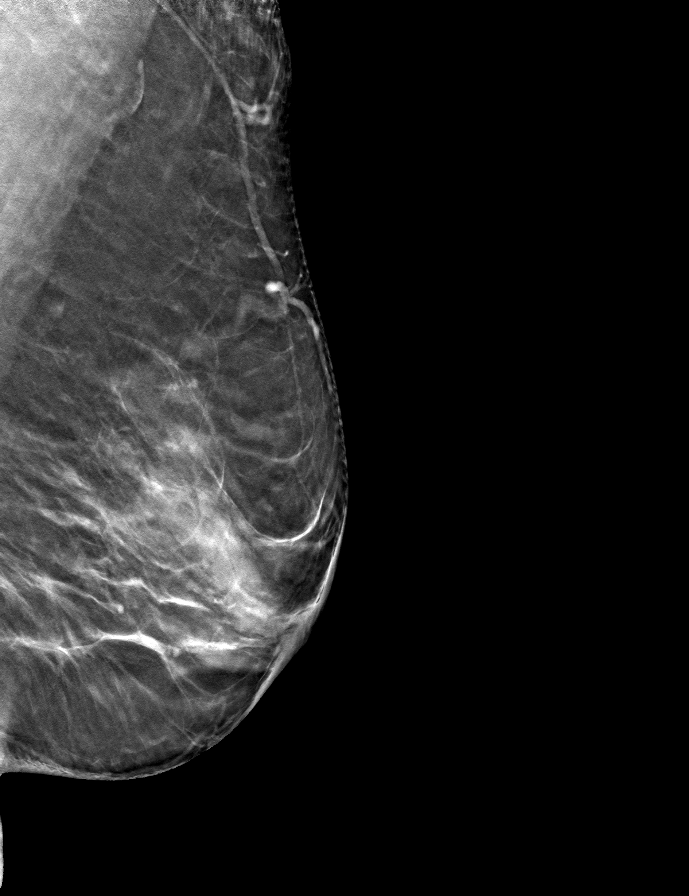

[9 of 24 positions shown; findings below may reference images not displayed]

ACR Breast Density Category c: The breast tissue is heterogeneously
dense, which may obscure small masses.
FINDINGS: In the right breast, a possible asymmetry warrants further
evaluation. In the left breast, no findings suspicious for
malignancy.
IMPRESSION: Further evaluation is suggested for possible asymmetry in the right
breast.

RECOMMENDATION:
Diagnostic mammogram and possibly ultrasound of the right breast.
(Code:[id number])

The patient will be contacted regarding the findings, and additional
imaging will be scheduled.

BI-RADS CATEGORY  0: Incomplete. Need additional imaging evaluation
and/or prior mammograms for comparison.

## 2022-11-21 DIAGNOSIS — M545 Low back pain, unspecified: Secondary | ICD-10-CM | POA: Diagnosis not present

## 2022-11-21 DIAGNOSIS — E785 Hyperlipidemia, unspecified: Secondary | ICD-10-CM | POA: Diagnosis not present

## 2022-11-21 DIAGNOSIS — I252 Old myocardial infarction: Secondary | ICD-10-CM | POA: Diagnosis not present

## 2022-11-21 DIAGNOSIS — I10 Essential (primary) hypertension: Secondary | ICD-10-CM | POA: Diagnosis not present

## 2022-11-21 DIAGNOSIS — F17210 Nicotine dependence, cigarettes, uncomplicated: Secondary | ICD-10-CM | POA: Diagnosis not present

## 2022-11-21 DIAGNOSIS — I2510 Atherosclerotic heart disease of native coronary artery without angina pectoris: Secondary | ICD-10-CM | POA: Diagnosis not present

## 2022-11-21 DIAGNOSIS — G8929 Other chronic pain: Secondary | ICD-10-CM | POA: Diagnosis not present

## 2022-11-28 DIAGNOSIS — I25118 Atherosclerotic heart disease of native coronary artery with other forms of angina pectoris: Secondary | ICD-10-CM | POA: Diagnosis not present

## 2022-11-28 DIAGNOSIS — I700 Atherosclerosis of aorta: Secondary | ICD-10-CM | POA: Diagnosis not present

## 2022-11-28 DIAGNOSIS — Z299 Encounter for prophylactic measures, unspecified: Secondary | ICD-10-CM | POA: Diagnosis not present

## 2022-11-28 DIAGNOSIS — M549 Dorsalgia, unspecified: Secondary | ICD-10-CM | POA: Diagnosis not present

## 2022-11-28 DIAGNOSIS — F17210 Nicotine dependence, cigarettes, uncomplicated: Secondary | ICD-10-CM | POA: Diagnosis not present

## 2022-11-28 DIAGNOSIS — I10 Essential (primary) hypertension: Secondary | ICD-10-CM | POA: Diagnosis not present

## 2022-12-06 IMAGING — US US BREAST*R* LIMITED INC AXILLA
1 series · 5 of 5 positions shown · non-contrast
Comparison: 11/26/2021 new baseline screening mammogram

CLINICAL DATA: 65-year-old female for further evaluation of
possible INNER RIGHT breast asymmetry on new baseline screening
mammogram.

EXAM:
DIGITAL DIAGNOSTIC UNILATERAL RIGHT MAMMOGRAM WITH TOMOSYNTHESIS AND
CAD; ULTRASOUND RIGHT BREAST LIMITED
TECHNIQUE: Right digital diagnostic mammography and breast tomosynthesis was
performed. The images were evaluated with computer-aided detection.;
Targeted ultrasound examination of the right breast was performed

[Series 1: us breast*right* limited inc axilla · 0.06mm/px · 5 of 5 slices shown]
[im 1/5]
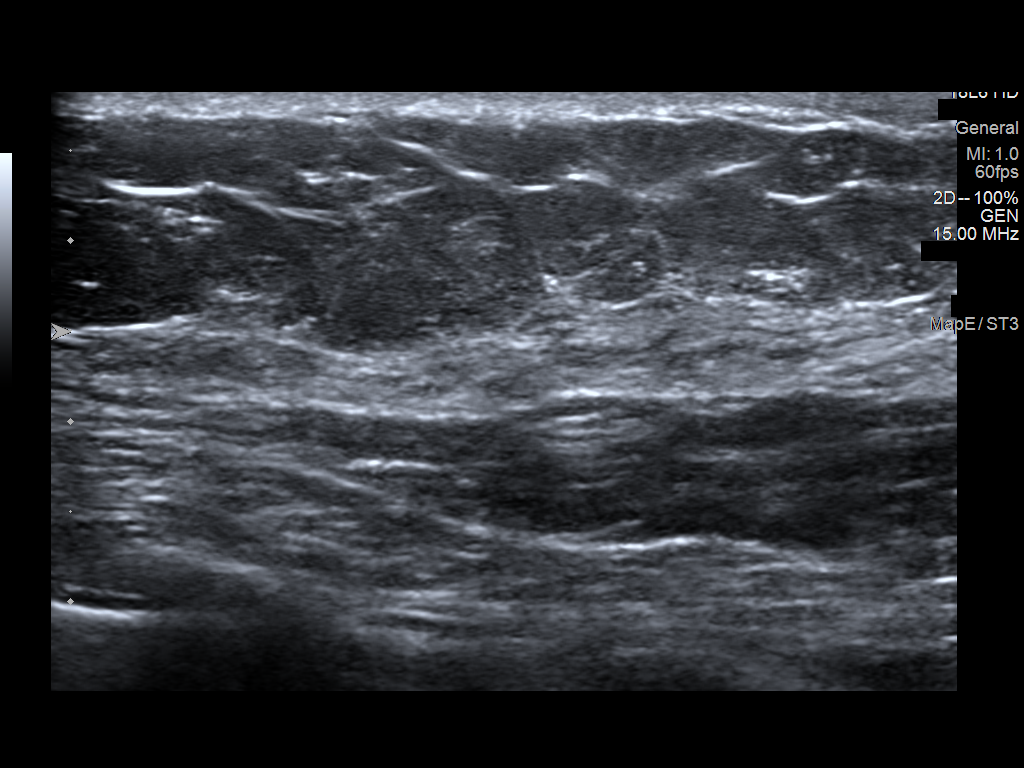
[im 2/5]
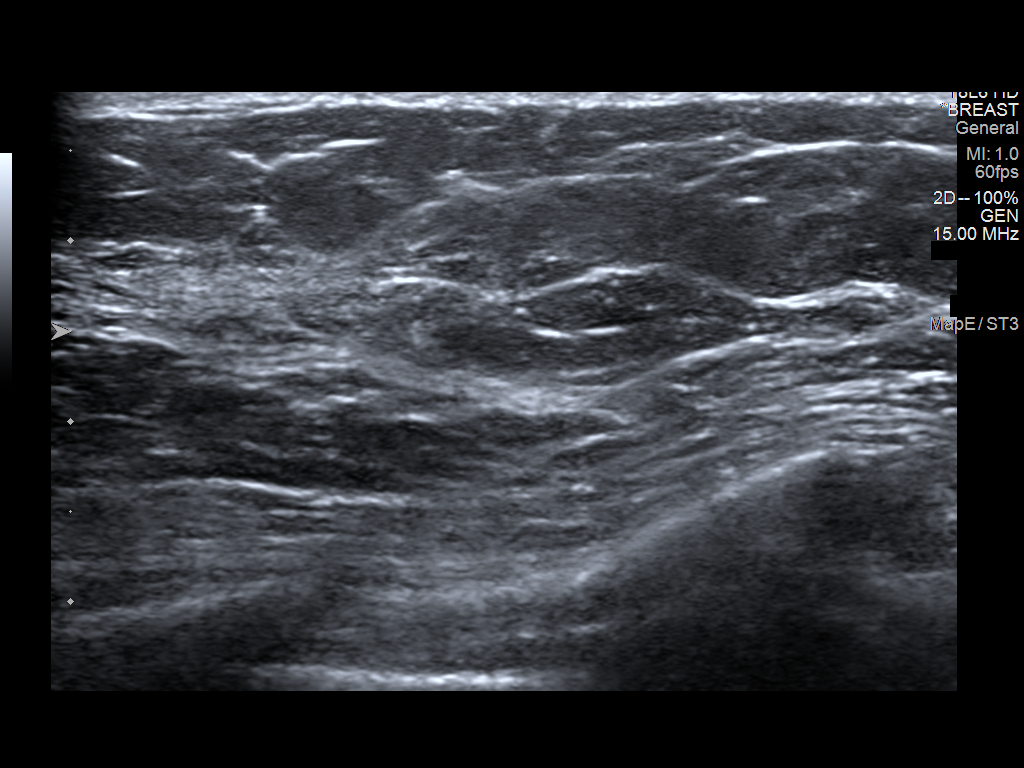
[im 3/5]
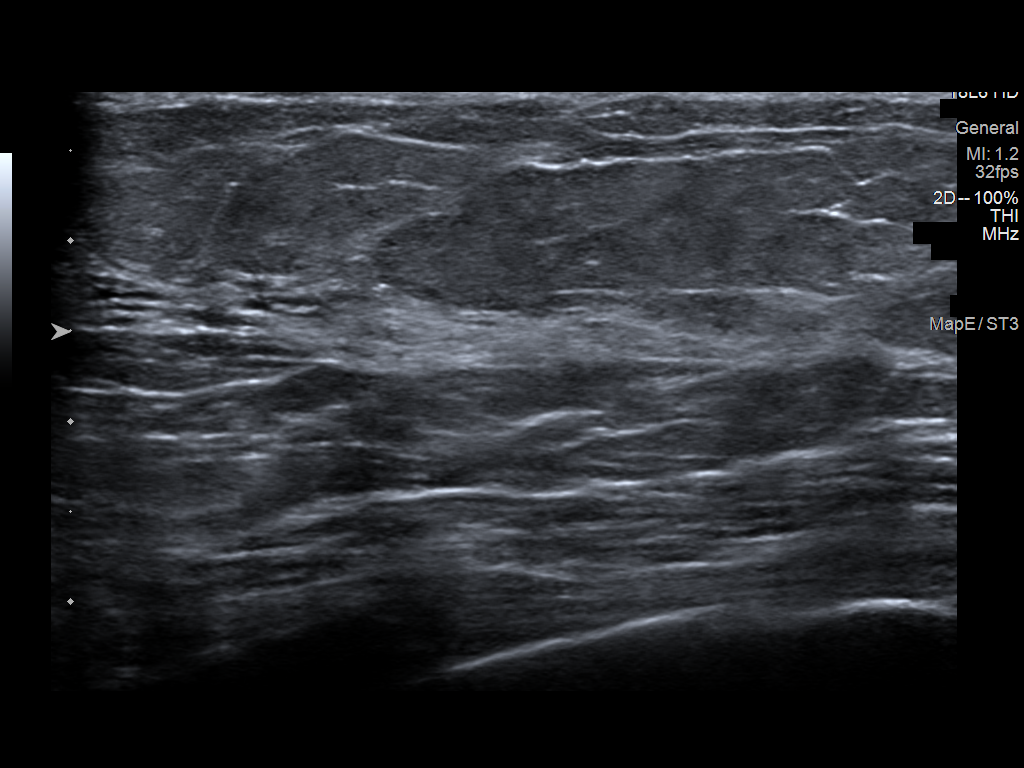
[im 4/5]
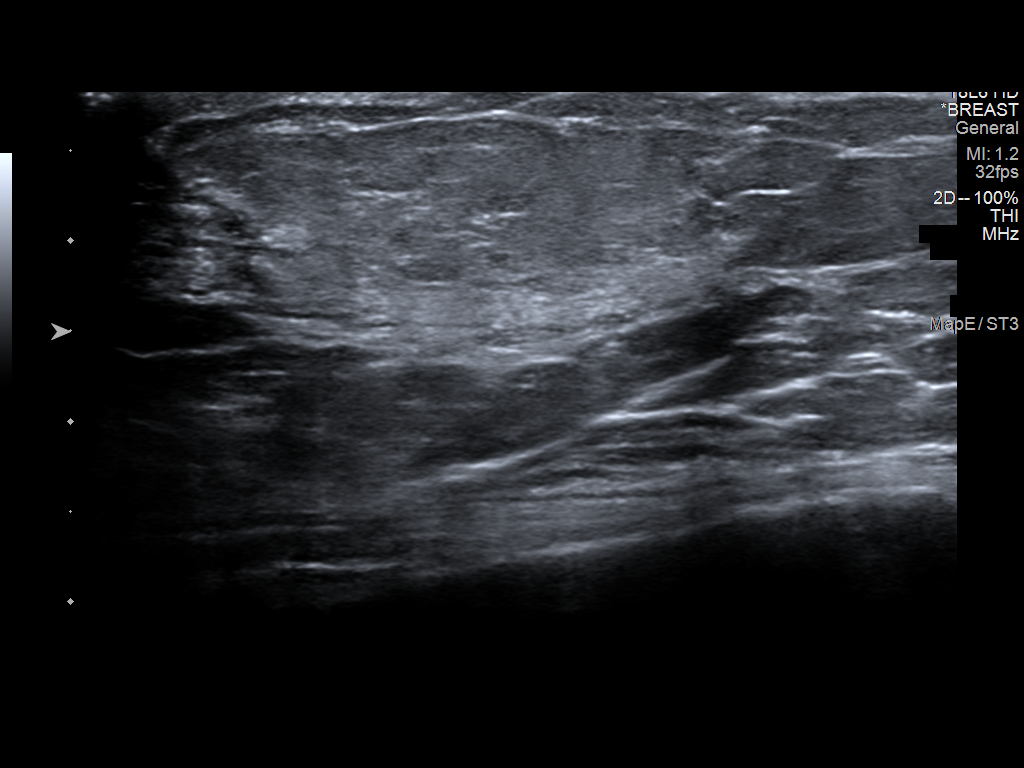
[im 5/5]
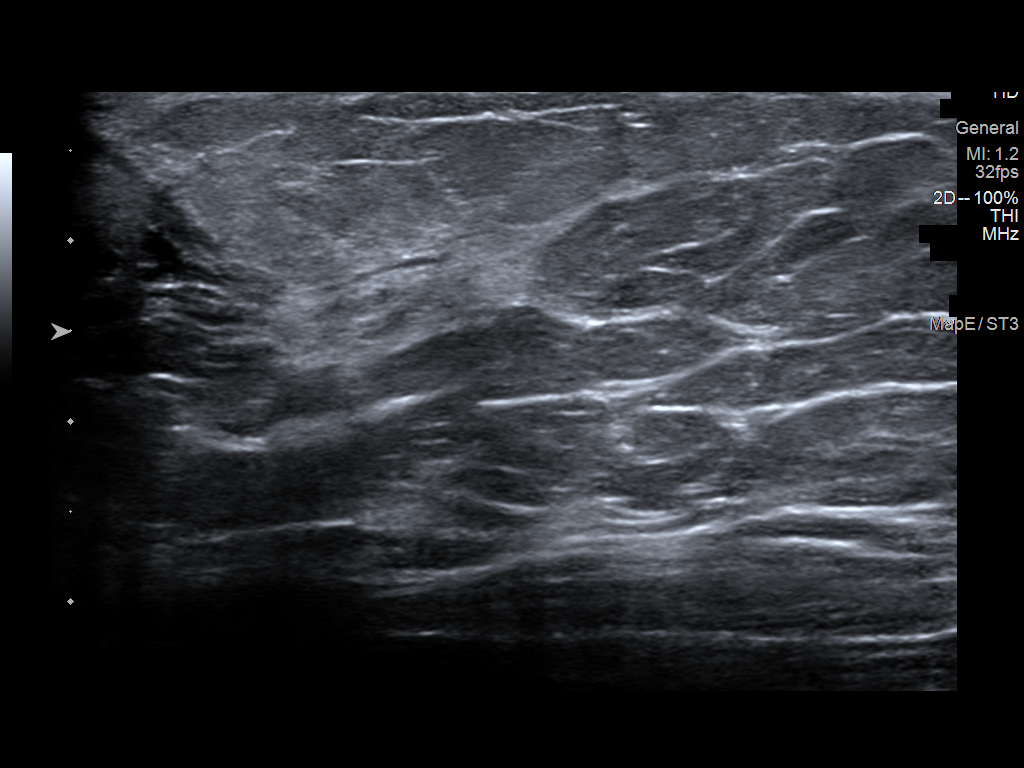

[5 of 5 positions shown; findings below may reference images not displayed]

ACR Breast Density Category c: The breast tissue is heterogeneously
dense, which may obscure small masses.
FINDINGS: 2D/3D full field and spot compression views of the RIGHT breast
demonstrate less conspicuous asymmetry within the INNER RIGHT
breast, only identified on the CC view.

Targeted ultrasound is performed, showing no sonographic abnormality
within the INNER RIGHT breast.
IMPRESSION: 1. Less conspicuous INNER RIGHT breast asymmetry and without
sonographic correlate. This likely represents a benign process or
normal fibroglandular tissue but 6 month follow-up is recommended to
ensure stability.

RECOMMENDATION:
RIGHT diagnostic mammogram with possible RIGHT breast ultrasound in
6 months.

I have discussed the findings and recommendations with the patient.
If applicable, a reminder letter will be sent to the patient
regarding the next appointment.

BI-RADS CATEGORY  3: Probably benign.

## 2022-12-19 DIAGNOSIS — Z2821 Immunization not carried out because of patient refusal: Secondary | ICD-10-CM | POA: Diagnosis not present

## 2022-12-19 DIAGNOSIS — Z299 Encounter for prophylactic measures, unspecified: Secondary | ICD-10-CM | POA: Diagnosis not present

## 2022-12-19 DIAGNOSIS — I10 Essential (primary) hypertension: Secondary | ICD-10-CM | POA: Diagnosis not present

## 2022-12-19 DIAGNOSIS — Z79899 Other long term (current) drug therapy: Secondary | ICD-10-CM | POA: Diagnosis not present

## 2022-12-19 DIAGNOSIS — Z7189 Other specified counseling: Secondary | ICD-10-CM | POA: Diagnosis not present

## 2022-12-19 DIAGNOSIS — E559 Vitamin D deficiency, unspecified: Secondary | ICD-10-CM | POA: Diagnosis not present

## 2022-12-19 DIAGNOSIS — R5383 Other fatigue: Secondary | ICD-10-CM | POA: Diagnosis not present

## 2022-12-19 DIAGNOSIS — Z0000 Encounter for general adult medical examination without abnormal findings: Secondary | ICD-10-CM | POA: Diagnosis not present

## 2022-12-25 NOTE — Progress Notes (Unsigned)
Primary Physician/Referring:  Monico Blitz, MD  Patient ID: Kristy Hall, female    DOB: Oct 09, 1956, 67 y.o.   MRN: [id number]  No chief complaint on file.  HPI:    Kristy Hall  is a 67 y.o. 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    This annual visit. No symptoms of angina, she has not had any significant dyspnea, she has noticed occasional leg edema.  Denies symptoms of claudication or TIA.  She is taking all her medications as prescribed.  Past Medical History:  Diagnosis Date   Hypertension    Mixed hyperlipidemia 11/05/2019   SCCA (squamous cell carcinoma) of skin 10/18/2020   Right Lower Leg-Anterior (well diff)    SCCA (squamous cell carcinoma) of skin 05/29/2022   Mid Supratip of Nose (in situ) (tx p bx)   Tobacco use disorder 11/05/2019   Past Surgical History:  Procedure Laterality Date   ABDOMINAL HYSTERECTOMY     BLADDER SUSPENSION     CARPAL TUNNEL RELEASE Bilateral    CORONARY STENT INTERVENTION N/A 11/05/2019   Procedure: CORONARY STENT INTERVENTION;  Surgeon: Adrian Prows, MD;  Location: [place];  Service: Cardiovascular;  Laterality: N/A;   HEMORROIDECTOMY     LEFT HEART CATH AND CORONARY ANGIOGRAPHY N/A 11/05/2019   Procedure: LEFT HEART CATH AND CORONARY ANGIOGRAPHY;  Surgeon: Adrian Prows, MD;  Location: [facility];  Service: Cardiovascular;  Laterality: N/A;    Family History  Problem Relation Age of Onset   COPD Mother    Basal cell carcinoma Mother    COPD Brother    Breast cancer Neg Hx     Social History   Tobacco Use   Smoking status: Former    Packs/day: 0.50    Years: 15.00    Total pack years: 7.50    Types: Cigarettes    Quit date: 04/2020    Years since quitting: 2.6   Smokeless tobacco: Never  Substance Use  Topics   Alcohol use: Yes    Comment: occasional   Marital Status: Widowed  ROS  Review of Systems  Cardiovascular:  Negative for chest pain, dyspnea on exertion and leg swelling.  Gastrointestinal:  Negative for melena.   Objective  There were no vitals taken for this visit.     12/26/2021    9:50 AM 12/26/2020    3:43 PM 05/25/2020    8:43 AM  Vitals with BMI  Height '5\' 0"'$  '5\' 0"'$  '5\' 0"'$   Weight 136 lbs 3 oz 137 lbs 138 lbs  BMI 26.6 50.09 38.18  Systolic 299 371 696  Diastolic 79 76 77  Pulse 62 62 64      Physical Exam Neck:     Vascular: Carotid bruit (bilateral) present. No JVD.  Cardiovascular:     Rate and Rhythm: Normal rate and regular rhythm.     Pulses: Intact distal pulses.     Heart sounds: Normal heart sounds. No murmur heard.    No gallop.     Comments: No leg edema, no JVD. Pulmonary:     Effort: Pulmonary effort is normal.     Breath sounds: Normal breath sounds.  Abdominal:     General: Bowel sounds are normal.     Palpations: Abdomen is soft.    Laboratory examination:   External Labs:  Cholesterol, total 159.000 m 12/19/2021 HDL 43.000 mg 12/19/2021 LDL 92.000 mg 12/19/2021 Triglycerides 136.000 m 12/19/2021  Hemoglobin 13.800 g/d 12/19/2021 Platelets 241.000 x1 12/19/2021  Creatinine, Serum 0.560 mg/ 12/19/2021 Potassium 3.800 mm 12/19/2021 Magnesium N/D ALT (SGPT) 15.000 IU/ 12/19/2021  TSH 1.950 12/19/2021   Medications and allergies  No Known Allergies    Current Outpatient Medications:    acetaminophen (TYLENOL) 325 MG tablet, Take 2 tablets (650 mg total) by mouth every 4 (four) hours as needed for headache or mild pain., Disp:  , Rfl:    Ascorbic Acid (VITAMIN C PO), Take by mouth., Disp: , Rfl:    aspirin (ASPIRIN CHILDRENS) 81 MG chewable tablet, Chew 1 tablet (81 mg total) by mouth daily., Disp: , Rfl:    atorvastatin (LIPITOR) 80 MG tablet, TAKE 1 TABLET BY MOUTH ONCE  DAILY AT 6 PM, Disp: 90 tablet, Rfl: 3   LORazepam  (ATIVAN) 0.5 MG tablet, Take 0.5 mg by mouth as needed. , Disp: , Rfl:    losartan (COZAAR) 100 MG tablet, TAKE 1 TABLET BY MOUTH  DAILY AFTER SUPPER, Disp: 90 tablet, Rfl: 3   metoprolol succinate (TOPROL-XL) 50 MG 24 hr tablet, Take 1 tablet (50 mg total) by mouth daily., Disp: 90 tablet, Rfl: 3   VITAMIN D PO, Take 1,000 mg by mouth., Disp: , Rfl:     Radiology:  No results found.   Cardiac Studies:   Left heart catheterization 11/05/2019: LV: Normal LV systolic function without wall motion abnormality.  Normal LVEDP. RCA: Dominant.  Proximal 40% stenosis.  Mid to distal 20% stenosis. Left main: Normal. LAD: Large caliber vessel, diffusely diseased mild to moderately.  No high-grade stenosis evident.  Gives origin to a moderate sized D1 and several small D. Ramus intermediate: Small sized vessel, tortuous, mild diffuse disease. Circumflex: Large area of distribution, gives origin to moderate sized OM1, proximal circumflex has a 95% ulcerated stenosis.  SP 2.5 x 18 mm resolute Onyx DES reduced to 0% with TIMI-3 to TIMI-3 flow.  Carotid artery duplex  12/09/2019:  Stenosis in the right external carotid artery (<50%).  Minimal stenosis in the left internal carotid artery (minimal).  Stenosis in the left external carotid artery (<50%).  Antegrade right vertebral artery flow. Antegrade left vertebral artery flow.  Follow up  studies when clinically indicated.  Echocardiogram 12/09/2019:  Left ventricle cavity is normal in size. Mild concentric hypertrophy of the left ventricle. Normal global wall motion. Normal LV systolic function with EF 55%. Normal diastolic filling pattern. Calculated EF 55%.  Moderate (Grade II) mitral regurgitation.  Mild to moderate tricuspid regurgitation. Estimated pulmonary artery systolic pressure is 25 mmHg.  Insignificant pericardial effusion.  Exercise tetrofosmin stress test  04/18/2020:([facility], [place], [place]): No reversible ischemia or infarction.   Mild apical thinning.  LVEF 60%.  Low risk.  EKG:  *** EKG 12/26/2020: Normal sinus rhythm at rate of 62 bpm, normal axis.  Incomplete right bundle branch block.  No evidence of ischemia otherwise normal EKG.  No change from 12/26/2020.  Assessment     ICD-10-CM   1. Coronary artery disease of native artery of native heart with stable angina pectoris ([place])  I25.118     2. Primary hypertension  I10     3. Mixed hyperlipidemia  E78.2       No orders of the defined types were placed in this encounter.  There are no discontinued medications.   No orders of the defined types were placed in this  encounter.    Recommendations:   Kristy Hall  is a 67 y.o. 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    1. Coronary artery disease of native artery of native heart with stable angina pectoris ([place]) ***  2. Primary hypertension ***  3. Mixed hyperlipidemia ***  4. Tobacco use disorder ***    Adrian Prows, MD, [facility] 12/25/2022, 9:46 PM Office: [phone] Pager: [phone]

## 2022-12-26 ENCOUNTER — Ambulatory Visit: Payer: Medicare Other | Admitting: Cardiology

## 2022-12-26 ENCOUNTER — Ambulatory Visit
Admission: RE | Admit: 2022-12-26 | Discharge: 2022-12-26 | Disposition: A | Discharge status: Home / Self Care | Payer: Medicare Other | Source: Ambulatory Visit | Attending: Internal Medicine | Admitting: Internal Medicine

## 2022-12-26 ENCOUNTER — Encounter: Payer: Self-pay | Admitting: Cardiology

## 2022-12-26 VITALS — BP 123/76 | HR 70 | Resp 18 | Ht 60.0 in | Wt 140.4 lb

## 2022-12-26 DIAGNOSIS — F17200 Nicotine dependence, unspecified, uncomplicated: Secondary | ICD-10-CM

## 2022-12-26 DIAGNOSIS — I25118 Atherosclerotic heart disease of native coronary artery with other forms of angina pectoris: Secondary | ICD-10-CM

## 2022-12-26 DIAGNOSIS — R922 Inconclusive mammogram: Secondary | ICD-10-CM | POA: Diagnosis not present

## 2022-12-26 DIAGNOSIS — N6489 Other specified disorders of breast: Secondary | ICD-10-CM

## 2022-12-26 DIAGNOSIS — E782 Mixed hyperlipidemia: Secondary | ICD-10-CM

## 2022-12-26 DIAGNOSIS — I10 Essential (primary) hypertension: Secondary | ICD-10-CM | POA: Diagnosis not present

## 2023-01-01 ENCOUNTER — Other Ambulatory Visit: Payer: Self-pay | Admitting: Cardiology

## 2023-01-01 DIAGNOSIS — I25118 Atherosclerotic heart disease of native coronary artery with other forms of angina pectoris: Secondary | ICD-10-CM

## 2023-01-01 DIAGNOSIS — I10 Essential (primary) hypertension: Secondary | ICD-10-CM

## 2023-01-17 DIAGNOSIS — F17210 Nicotine dependence, cigarettes, uncomplicated: Secondary | ICD-10-CM | POA: Diagnosis not present

## 2023-01-17 DIAGNOSIS — I10 Essential (primary) hypertension: Secondary | ICD-10-CM | POA: Diagnosis not present

## 2023-01-17 DIAGNOSIS — L03119 Cellulitis of unspecified part of limb: Secondary | ICD-10-CM | POA: Diagnosis not present

## 2023-01-17 DIAGNOSIS — I700 Atherosclerosis of aorta: Secondary | ICD-10-CM | POA: Diagnosis not present

## 2023-01-17 DIAGNOSIS — I25118 Atherosclerotic heart disease of native coronary artery with other forms of angina pectoris: Secondary | ICD-10-CM | POA: Diagnosis not present

## 2023-01-17 DIAGNOSIS — Z299 Encounter for prophylactic measures, unspecified: Secondary | ICD-10-CM | POA: Diagnosis not present

## 2023-01-21 DIAGNOSIS — L570 Actinic keratosis: Secondary | ICD-10-CM | POA: Diagnosis not present

## 2023-01-21 DIAGNOSIS — X32XXXD Exposure to sunlight, subsequent encounter: Secondary | ICD-10-CM | POA: Diagnosis not present

## 2023-01-21 DIAGNOSIS — L720 Epidermal cyst: Secondary | ICD-10-CM | POA: Diagnosis not present

## 2023-05-07 ENCOUNTER — Other Ambulatory Visit: Payer: Self-pay | Admitting: Cardiology

## 2023-05-07 DIAGNOSIS — E782 Mixed hyperlipidemia: Secondary | ICD-10-CM

## 2023-06-20 DIAGNOSIS — Z7189 Other specified counseling: Secondary | ICD-10-CM | POA: Diagnosis not present

## 2023-06-20 DIAGNOSIS — F17210 Nicotine dependence, cigarettes, uncomplicated: Secondary | ICD-10-CM | POA: Diagnosis not present

## 2023-06-20 DIAGNOSIS — I25118 Atherosclerotic heart disease of native coronary artery with other forms of angina pectoris: Secondary | ICD-10-CM | POA: Diagnosis not present

## 2023-06-20 DIAGNOSIS — I10 Essential (primary) hypertension: Secondary | ICD-10-CM | POA: Diagnosis not present

## 2023-06-20 DIAGNOSIS — Z299 Encounter for prophylactic measures, unspecified: Secondary | ICD-10-CM | POA: Diagnosis not present

## 2023-06-20 DIAGNOSIS — I700 Atherosclerosis of aorta: Secondary | ICD-10-CM | POA: Diagnosis not present

## 2023-06-20 DIAGNOSIS — Z0000 Encounter for general adult medical examination without abnormal findings: Secondary | ICD-10-CM | POA: Diagnosis not present

## 2023-07-14 DIAGNOSIS — L570 Actinic keratosis: Secondary | ICD-10-CM | POA: Diagnosis not present

## 2023-07-14 DIAGNOSIS — X32XXXD Exposure to sunlight, subsequent encounter: Secondary | ICD-10-CM | POA: Diagnosis not present

## 2023-08-06 NOTE — Telephone Encounter (Signed)
done

## 2023-09-16 ENCOUNTER — Other Ambulatory Visit: Payer: Self-pay | Admitting: Cardiology

## 2023-09-16 DIAGNOSIS — I25118 Atherosclerotic heart disease of native coronary artery with other forms of angina pectoris: Secondary | ICD-10-CM

## 2023-09-16 DIAGNOSIS — I10 Essential (primary) hypertension: Secondary | ICD-10-CM

## 2023-09-24 DIAGNOSIS — F17210 Nicotine dependence, cigarettes, uncomplicated: Secondary | ICD-10-CM | POA: Diagnosis not present

## 2023-09-24 DIAGNOSIS — Z299 Encounter for prophylactic measures, unspecified: Secondary | ICD-10-CM | POA: Diagnosis not present

## 2023-09-24 DIAGNOSIS — Z1211 Encounter for screening for malignant neoplasm of colon: Secondary | ICD-10-CM | POA: Diagnosis not present

## 2023-09-24 DIAGNOSIS — I10 Essential (primary) hypertension: Secondary | ICD-10-CM | POA: Diagnosis not present

## 2023-09-24 DIAGNOSIS — M7061 Trochanteric bursitis, right hip: Secondary | ICD-10-CM | POA: Diagnosis not present

## 2023-09-24 DIAGNOSIS — Z2821 Immunization not carried out because of patient refusal: Secondary | ICD-10-CM | POA: Diagnosis not present

## 2023-09-24 DIAGNOSIS — R5383 Other fatigue: Secondary | ICD-10-CM | POA: Diagnosis not present

## 2023-09-24 DIAGNOSIS — Z0000 Encounter for general adult medical examination without abnormal findings: Secondary | ICD-10-CM | POA: Diagnosis not present

## 2023-09-24 DIAGNOSIS — R1310 Dysphagia, unspecified: Secondary | ICD-10-CM | POA: Diagnosis not present

## 2023-09-24 DIAGNOSIS — E7800 Pure hypercholesterolemia, unspecified: Secondary | ICD-10-CM | POA: Diagnosis not present

## 2023-09-25 DIAGNOSIS — E559 Vitamin D deficiency, unspecified: Secondary | ICD-10-CM | POA: Diagnosis not present

## 2023-09-25 DIAGNOSIS — E7800 Pure hypercholesterolemia, unspecified: Secondary | ICD-10-CM | POA: Diagnosis not present

## 2023-09-25 DIAGNOSIS — R5383 Other fatigue: Secondary | ICD-10-CM | POA: Diagnosis not present

## 2023-09-25 DIAGNOSIS — Z79899 Other long term (current) drug therapy: Secondary | ICD-10-CM | POA: Diagnosis not present

## 2023-10-07 DIAGNOSIS — F17210 Nicotine dependence, cigarettes, uncomplicated: Secondary | ICD-10-CM | POA: Diagnosis not present

## 2023-10-07 DIAGNOSIS — Z122 Encounter for screening for malignant neoplasm of respiratory organs: Secondary | ICD-10-CM | POA: Diagnosis not present

## 2023-10-23 DIAGNOSIS — R1310 Dysphagia, unspecified: Secondary | ICD-10-CM | POA: Diagnosis not present

## 2023-10-23 DIAGNOSIS — R911 Solitary pulmonary nodule: Secondary | ICD-10-CM | POA: Diagnosis not present

## 2023-10-23 DIAGNOSIS — Z299 Encounter for prophylactic measures, unspecified: Secondary | ICD-10-CM | POA: Diagnosis not present

## 2023-10-23 DIAGNOSIS — I10 Essential (primary) hypertension: Secondary | ICD-10-CM | POA: Diagnosis not present

## 2023-10-28 DIAGNOSIS — Z23 Encounter for immunization: Secondary | ICD-10-CM | POA: Diagnosis not present

## 2023-12-19 DIAGNOSIS — I10 Essential (primary) hypertension: Secondary | ICD-10-CM | POA: Diagnosis not present

## 2023-12-19 DIAGNOSIS — R918 Other nonspecific abnormal finding of lung field: Secondary | ICD-10-CM | POA: Diagnosis not present

## 2023-12-19 DIAGNOSIS — Z299 Encounter for prophylactic measures, unspecified: Secondary | ICD-10-CM | POA: Diagnosis not present

## 2023-12-19 DIAGNOSIS — F17210 Nicotine dependence, cigarettes, uncomplicated: Secondary | ICD-10-CM | POA: Diagnosis not present

## 2023-12-19 DIAGNOSIS — R079 Chest pain, unspecified: Secondary | ICD-10-CM | POA: Diagnosis not present

## 2023-12-19 DIAGNOSIS — M8580 Other specified disorders of bone density and structure, unspecified site: Secondary | ICD-10-CM | POA: Diagnosis not present

## 2023-12-29 ENCOUNTER — Ambulatory Visit: Payer: Self-pay | Admitting: Cardiology

## 2024-04-05 DIAGNOSIS — M7062 Trochanteric bursitis, left hip: Secondary | ICD-10-CM | POA: Diagnosis not present

## 2024-04-05 DIAGNOSIS — R1310 Dysphagia, unspecified: Secondary | ICD-10-CM | POA: Diagnosis not present

## 2024-04-05 DIAGNOSIS — I10 Essential (primary) hypertension: Secondary | ICD-10-CM | POA: Diagnosis not present

## 2024-04-05 DIAGNOSIS — R911 Solitary pulmonary nodule: Secondary | ICD-10-CM | POA: Diagnosis not present

## 2024-04-05 DIAGNOSIS — Z299 Encounter for prophylactic measures, unspecified: Secondary | ICD-10-CM | POA: Diagnosis not present

## 2024-04-05 DIAGNOSIS — I700 Atherosclerosis of aorta: Secondary | ICD-10-CM | POA: Diagnosis not present

## 2024-04-08 DIAGNOSIS — R911 Solitary pulmonary nodule: Secondary | ICD-10-CM | POA: Diagnosis not present

## 2024-04-08 DIAGNOSIS — R918 Other nonspecific abnormal finding of lung field: Secondary | ICD-10-CM | POA: Diagnosis not present

## 2024-04-08 DIAGNOSIS — F17210 Nicotine dependence, cigarettes, uncomplicated: Secondary | ICD-10-CM | POA: Diagnosis not present

## 2024-04-08 DIAGNOSIS — J432 Centrilobular emphysema: Secondary | ICD-10-CM | POA: Diagnosis not present

## 2024-04-08 DIAGNOSIS — Z122 Encounter for screening for malignant neoplasm of respiratory organs: Secondary | ICD-10-CM | POA: Diagnosis not present

## 2024-05-22 ENCOUNTER — Other Ambulatory Visit: Payer: Self-pay | Admitting: Cardiology

## 2024-05-22 DIAGNOSIS — I25118 Atherosclerotic heart disease of native coronary artery with other forms of angina pectoris: Secondary | ICD-10-CM

## 2024-05-22 DIAGNOSIS — I10 Essential (primary) hypertension: Secondary | ICD-10-CM

## 2024-05-25 DIAGNOSIS — Z7189 Other specified counseling: Secondary | ICD-10-CM | POA: Diagnosis not present

## 2024-05-25 DIAGNOSIS — Z299 Encounter for prophylactic measures, unspecified: Secondary | ICD-10-CM | POA: Diagnosis not present

## 2024-05-25 DIAGNOSIS — Z0000 Encounter for general adult medical examination without abnormal findings: Secondary | ICD-10-CM | POA: Diagnosis not present

## 2024-05-25 DIAGNOSIS — Z1389 Encounter for screening for other disorder: Secondary | ICD-10-CM | POA: Diagnosis not present

## 2024-05-25 DIAGNOSIS — I10 Essential (primary) hypertension: Secondary | ICD-10-CM | POA: Diagnosis not present

## 2024-05-25 DIAGNOSIS — I25118 Atherosclerotic heart disease of native coronary artery with other forms of angina pectoris: Secondary | ICD-10-CM | POA: Diagnosis not present

## 2024-05-25 DIAGNOSIS — F17210 Nicotine dependence, cigarettes, uncomplicated: Secondary | ICD-10-CM | POA: Diagnosis not present

## 2024-06-19 DIAGNOSIS — S6392XA Sprain of unspecified part of left wrist and hand, initial encounter: Secondary | ICD-10-CM | POA: Diagnosis not present

## 2024-06-19 DIAGNOSIS — S63502A Unspecified sprain of left wrist, initial encounter: Secondary | ICD-10-CM | POA: Diagnosis not present

## 2024-06-19 DIAGNOSIS — I2510 Atherosclerotic heart disease of native coronary artery without angina pectoris: Secondary | ICD-10-CM | POA: Diagnosis not present

## 2024-06-19 DIAGNOSIS — M25532 Pain in left wrist: Secondary | ICD-10-CM | POA: Diagnosis not present

## 2024-06-19 DIAGNOSIS — I10 Essential (primary) hypertension: Secondary | ICD-10-CM | POA: Diagnosis not present

## 2024-06-19 DIAGNOSIS — Z791 Long term (current) use of non-steroidal anti-inflammatories (NSAID): Secondary | ICD-10-CM | POA: Diagnosis not present

## 2024-06-19 DIAGNOSIS — E785 Hyperlipidemia, unspecified: Secondary | ICD-10-CM | POA: Diagnosis not present

## 2024-06-19 DIAGNOSIS — F17210 Nicotine dependence, cigarettes, uncomplicated: Secondary | ICD-10-CM | POA: Diagnosis not present

## 2024-06-19 DIAGNOSIS — X58XXXA Exposure to other specified factors, initial encounter: Secondary | ICD-10-CM | POA: Diagnosis not present

## 2024-06-19 DIAGNOSIS — M1812 Unilateral primary osteoarthritis of first carpometacarpal joint, left hand: Secondary | ICD-10-CM | POA: Diagnosis not present

## 2024-07-13 DIAGNOSIS — Z1212 Encounter for screening for malignant neoplasm of rectum: Secondary | ICD-10-CM | POA: Diagnosis not present

## 2024-07-13 DIAGNOSIS — Z1211 Encounter for screening for malignant neoplasm of colon: Secondary | ICD-10-CM | POA: Diagnosis not present

## 2024-07-18 ENCOUNTER — Other Ambulatory Visit: Payer: Self-pay | Admitting: Cardiology

## 2024-07-18 DIAGNOSIS — I10 Essential (primary) hypertension: Secondary | ICD-10-CM

## 2024-07-18 DIAGNOSIS — I25118 Atherosclerotic heart disease of native coronary artery with other forms of angina pectoris: Secondary | ICD-10-CM

## 2024-07-28 DIAGNOSIS — R52 Pain, unspecified: Secondary | ICD-10-CM | POA: Diagnosis not present

## 2024-07-28 DIAGNOSIS — I10 Essential (primary) hypertension: Secondary | ICD-10-CM | POA: Diagnosis not present

## 2024-07-28 DIAGNOSIS — F17210 Nicotine dependence, cigarettes, uncomplicated: Secondary | ICD-10-CM | POA: Diagnosis not present

## 2024-07-28 DIAGNOSIS — Z299 Encounter for prophylactic measures, unspecified: Secondary | ICD-10-CM | POA: Diagnosis not present

## 2024-07-28 DIAGNOSIS — R1013 Epigastric pain: Secondary | ICD-10-CM | POA: Diagnosis not present

## 2024-08-02 ENCOUNTER — Encounter (INDEPENDENT_AMBULATORY_CARE_PROVIDER_SITE_OTHER): Payer: Self-pay | Admitting: *Deleted

## 2024-08-05 DIAGNOSIS — R1013 Epigastric pain: Secondary | ICD-10-CM | POA: Diagnosis not present

## 2024-08-05 DIAGNOSIS — R52 Pain, unspecified: Secondary | ICD-10-CM | POA: Diagnosis not present

## 2024-08-05 DIAGNOSIS — Z299 Encounter for prophylactic measures, unspecified: Secondary | ICD-10-CM | POA: Diagnosis not present

## 2024-08-05 DIAGNOSIS — I10 Essential (primary) hypertension: Secondary | ICD-10-CM | POA: Diagnosis not present

## 2024-08-13 ENCOUNTER — Ambulatory Visit (INDEPENDENT_AMBULATORY_CARE_PROVIDER_SITE_OTHER): Admitting: Gastroenterology

## 2024-08-13 ENCOUNTER — Encounter (INDEPENDENT_AMBULATORY_CARE_PROVIDER_SITE_OTHER): Payer: Self-pay | Admitting: Gastroenterology

## 2024-08-13 VITALS — BP 146/75 | HR 71 | Temp 97.9°F | Ht 60.0 in | Wt 140.6 lb

## 2024-08-13 DIAGNOSIS — Z87891 Personal history of nicotine dependence: Secondary | ICD-10-CM

## 2024-08-13 DIAGNOSIS — K5900 Constipation, unspecified: Secondary | ICD-10-CM

## 2024-08-13 DIAGNOSIS — R1310 Dysphagia, unspecified: Secondary | ICD-10-CM | POA: Diagnosis not present

## 2024-08-13 DIAGNOSIS — R1319 Other dysphagia: Secondary | ICD-10-CM | POA: Insufficient documentation

## 2024-08-13 DIAGNOSIS — K5904 Chronic idiopathic constipation: Secondary | ICD-10-CM | POA: Insufficient documentation

## 2024-08-13 DIAGNOSIS — R1013 Epigastric pain: Secondary | ICD-10-CM | POA: Diagnosis not present

## 2024-08-13 NOTE — Patient Instructions (Signed)
 It was very nice to meet you today, as dicussed with will plan for the following :  1) please get us  the records of CT scan once done  2) Upper endoscopy   3) Avoid using high dose aspirin  including Goody/BC powders, NSAIDs such as Aleve, ibuprofen, naproxen, Motrin, Voltaren or Advil (even the topical ones)  4)Ensure adequate fluid intake: Aim for 8 glasses of water daily. Follow a high fiber diet: Include foods such as dates, prunes, pears, and kiwi. Use Metamucil twice a day when you feel constipated

## 2024-08-13 NOTE — Progress Notes (Signed)
 Kristy Hall Faizan Cornie Mccomber , M.D. Gastroenterology & Hepatology [facility] Gastroenterology [address] [place], [place] [address] Primary Care Physician: Maree Isles, MD [address] [place] [address]  Chief Complaint: Epigastric pain, solid food dysphagia and intermittent constipation  History of Present Illness: Kristy Hall is a 68 y.o. female coronary artery disease status post PCI on aspirin , hypertension, hyperlipidemia, chronic tobacco user who presents for evaluation of Epigastric pain, solid food dysphagia and intermittent constipation  Patient reports since past 4 weeks she has epigastric burning sensation which is constantly present reports no relieving factors that may alleviate with movement.  Patient denies daily NSAID use but takes naproxen intermittently.  For knee pain she usually takes Tylenol .  Patient does report solid food dysphagia for many months where she would cut her food into small pieces,, have occasional regurgitation of food.  Patient has occasional hard stools with straining. The patient denies having any nausea, vomiting, fever, chills, hematochezia, melena, hematemesis,diarrhea, jaundice, pruritus or weight loss.  Labs reviewed without any anemia or elevated liver enzymes  As per patient she had a Cologuard test done 3 weeks ago which is negative  Last EGD:20 years ago  Last Colonoscopy:10+ years ago  FHx: neg for any gastrointestinal/liver disease, no malignancies Social: Daily tobacco smoker Surgical: Hysterectomy,  Past Medical History: Past Medical History:  Diagnosis Date   Hypertension    Mixed hyperlipidemia 11/05/2019   SCCA (squamous cell carcinoma) of skin 10/18/2020   Right Lower Leg-Anterior (well diff)    SCCA (squamous cell carcinoma) of skin 05/29/2022   Mid Supratip of Nose (in situ) (tx p bx)   Tobacco use disorder 11/05/2019    Past Surgical History: Past Surgical History:  Procedure Laterality Date    ABDOMINAL HYSTERECTOMY     BLADDER SUSPENSION     CARPAL TUNNEL RELEASE Bilateral    COLONOSCOPY     CORONARY STENT INTERVENTION N/A 11/05/2019   Procedure: CORONARY STENT INTERVENTION;  Surgeon: Ladona Heinz, MD;  [facility]: [facility];  Service: Cardiovascular;  Laterality: N/A;   HEMORROIDECTOMY     LEFT HEART CATH AND CORONARY ANGIOGRAPHY N/A 11/05/2019   Procedure: LEFT HEART CATH AND CORONARY ANGIOGRAPHY;  Surgeon: Ladona Heinz, MD;  Location: [facility];  Service: Cardiovascular;  Laterality: N/A;    Family History: Family History  Problem Relation Age of Onset   COPD Mother    Basal cell carcinoma Mother    COPD Brother    Breast cancer Neg Hx     Social History: Social History   Tobacco Use  Smoking Status Former   Current packs/day: 0.00   Average packs/day: 0.5 packs/day for 15.0 years (7.5 ttl pk-yrs)   Types: Cigarettes   Start date: 04/2005   Quit date: 04/2020   Years since quitting: 4.3  Smokeless Tobacco Never   Social History   Substance and Sexual Activity  Alcohol Use Yes   Comment: occasional   Social History   Substance and Sexual Activity  Drug Use No    Allergies: No Known Allergies  Medications: Current Outpatient Medications  Medication Sig Dispense Refill   acetaminophen  (TYLENOL ) 325 MG tablet Take 2 tablets (650 mg total) by mouth every 4 (four) hours as needed for headache or mild pain.     Ascorbic Acid (VITAMIN C PO) Take by mouth.     aspirin  (ASPIRIN  CHILDRENS) 81 MG chewable tablet Chew 1 tablet (81 mg total) by mouth daily.  atorvastatin  (LIPITOR) 80 MG tablet TAKE 1 TABLET BY MOUTH ONCE  DAILY AT 6 PM 100 tablet 2   Calcium  Carb-Cholecalciferol (CALCIUM -VITAMIN D PO) Take by mouth.     LORazepam (ATIVAN) 0.5 MG tablet Take 0.5 mg by mouth as needed.      losartan  (COZAAR ) 100 MG tablet TAKE 1 TABLET BY MOUTH DAILY  AFTER SUPPER (Patient taking differently: Take 50 mg by mouth daily.) 60 tablet 0    metoprolol  succinate (TOPROL -XL) 50 MG 24 hr tablet Take 1 tablet (50 mg total) by mouth daily. (Patient taking differently: Take 25 mg by mouth daily.) 90 tablet 3   pantoprazole (PROTONIX) 40 MG tablet Take by mouth.     No current facility-administered medications for this visit.    Review of Systems: GENERAL: negative for malaise, night sweats HEENT: No changes in hearing or vision, no nose bleeds or other nasal problems. NECK: Negative for lumps, goiter, pain and significant neck swelling RESPIRATORY: Negative for cough, wheezing CARDIOVASCULAR: Negative for chest pain, leg swelling, palpitations, orthopnea GI: SEE HPI MUSCULOSKELETAL: Negative for joint pain or swelling, back pain, and muscle pain. SKIN: Negative for lesions, rash HEMATOLOGY Negative for prolonged bleeding, bruising easily, and swollen nodes. ENDOCRINE: Negative for cold or heat intolerance, polyuria, polydipsia and goiter. NEURO: negative for tremor, gait imbalance, syncope and seizures. The remainder of the review of systems is noncontributory.   Physical Exam: BP (!) 146/75   Pulse 71   Temp 97.9 F (36.6 C)   Ht 5' (1.524 m)   Wt 140 lb 9.6 oz (63.8 kg)   BMI 27.46 kg/m  GENERAL: The patient is AO x3, in no acute distress. HEENT: Head is normocephalic and atraumatic. EOMI are intact. Mouth is well hydrated and without lesions. NECK: Supple. No masses LUNGS: Clear to auscultation. No presence of rhonchi/wheezing/rales. Adequate chest expansion HEART: RRR, normal s1 and s2. ABDOMEN: Soft, epigastric tenderness, no guarding, no peritoneal signs, and nondistended. BS +. No masses.  Imaging/Labs: as above     Latest Ref Rng & Units 11/06/2019    4:30 AM  CBC  WBC 4.0 - 10.5 K/uL 10.2   Hemoglobin 12.0 - 15.0 g/dL 87.3   Hematocrit 63.9 - 46.0 % 37.8   Platelets 150 - 400 K/uL 230    No results found for: IRON, TIBC, FERRITIN  I personally reviewed and interpreted the available labs,  imaging and endoscopic files.  Impression and Plan: Kristy Hall is a 68 y.o. female coronary artery disease status post PCI on aspirin , hypertension, hyperlipidemia, chronic tobacco user who presents for evaluation of Epigastric pain, solid food dysphagia and intermittent constipation  #Dysphagia #Epigastric tenderness  Patient is above the age of 77 with new onset abdominal pain/dyspepsia, chronic tobacco smoker and dysphagia are considered alarm symptoms.   This could be peptic ulcer disease, acid mediated or H. pylori mediated dyspepsia but with chronic tobacco smoking pancreatic lesion and esophageal gastric malignancy needs to be excluded  Dysphagia could be due to web ring or stricture  Upper endoscopy with biopsy/dilation  Continue Protonix 30 minutes before breakfast  Obtain records from CT scan with IV contrast being done next Wednesday at [facility],   I thoroughly discussed with the patient the procedure, including the risks involved. Patient understands what the procedure involves including the benefits and any risks. Patient understands alternatives to the proposed procedure. Risks including (but not limited to) bleeding, tearing of the lining (perforation), rupture of adjacent organs, problems with heart and lung  function, infection, and medication reactions. A small percentage of complications may require surgery, hospitalization, repeat endoscopic procedure, and/or transfusion.  Patient understood and agreed.   #Constipation   Ensure adequate fluid intake: Aim for 8 glasses of water daily. Follow a high fiber diet: Include foods such as dates, prunes, pears, and kiwi. Take Miralax twice a day for the first week, then reduce to once daily thereafter. Use Metamucil twice a day.  #HCM As per patient she had negative Cologuard test done 3 weeks ago , Will obtain those records  All questions were answered.      Cameren Earnest Faizan Nakenya Theall, MD Gastroenterology and Hepatology [facility]astroenterology   This chart has been completed using [facility] Dictation software, and while attempts have been made to ensure accuracy , certain words and phrases may not be transcribed as intended

## 2024-08-18 DIAGNOSIS — N8110 Cystocele, unspecified: Secondary | ICD-10-CM | POA: Diagnosis not present

## 2024-08-18 DIAGNOSIS — K760 Fatty (change of) liver, not elsewhere classified: Secondary | ICD-10-CM | POA: Diagnosis not present

## 2024-08-18 DIAGNOSIS — R1084 Generalized abdominal pain: Secondary | ICD-10-CM | POA: Diagnosis not present

## 2024-08-19 ENCOUNTER — Telehealth (INDEPENDENT_AMBULATORY_CARE_PROVIDER_SITE_OTHER): Payer: Self-pay | Admitting: Gastroenterology

## 2024-08-19 NOTE — Telephone Encounter (Signed)
 Pt contacted. Pt states she was not concerned about the blood work, she wanted to know about the CT scan that was completed at [facility] yesterday. CT report in Care Everywhere. Please advise. Thank you! (I apologize for the miscommunication)

## 2024-08-19 NOTE — Telephone Encounter (Signed)
 Pt was seen by Dr Cinderella last Friday. Today she called to check on getting her procedure scheduled. I told her once the schedule was open the scheduler would be calling her to set that up. Then she said that her PCP ordered some tests and she has concerns about the results and asked if Dr Cinderella would review those. [phone]

## 2024-08-19 NOTE — Telephone Encounter (Signed)
 Hi Tanya ,  Can you please call the patient and tell the patient the lab work done on 08/05/2024 at [facility] did not show anything concerning. No anemia , normal liver enzymes and kidney function . I reviewed it again   Thanks,  Talton Delpriore Faizan Gerrell Tabet, MD Gastroenterology and Hepatology [facility] Gastroenterology

## 2024-08-19 NOTE — Telephone Encounter (Signed)
 Pt contacted and verbalized understanding.

## 2024-08-19 NOTE — Telephone Encounter (Signed)
 Thanks for clarification   It does not show anything acute or concerning interms of GI , reports fatty liver and a small cystocele ( which is descent of the urinary bladder into the anterior wall of the vagina) . She may discuss this with her PCP or GYN   =============   IMPRESSION:  1. Negative for acute inflammatory process within the abdomen and pelvis.  2. Hepatic steatosis.  3. Small cystocele.

## 2024-08-19 NOTE — Telephone Encounter (Signed)
 Pt contacted and advised that we could not see PCP labs. Pt states when she was in the other day, he pulled them up and they looked beautiful. Please advise. Thank you

## 2024-08-25 DIAGNOSIS — M11261 Other chondrocalcinosis, right knee: Secondary | ICD-10-CM | POA: Diagnosis not present

## 2024-08-26 DIAGNOSIS — R52 Pain, unspecified: Secondary | ICD-10-CM | POA: Diagnosis not present

## 2024-08-26 DIAGNOSIS — Z299 Encounter for prophylactic measures, unspecified: Secondary | ICD-10-CM | POA: Diagnosis not present

## 2024-08-26 DIAGNOSIS — R1013 Epigastric pain: Secondary | ICD-10-CM | POA: Diagnosis not present

## 2024-08-26 DIAGNOSIS — I10 Essential (primary) hypertension: Secondary | ICD-10-CM | POA: Diagnosis not present

## 2024-09-02 ENCOUNTER — Telehealth (INDEPENDENT_AMBULATORY_CARE_PROVIDER_SITE_OTHER): Payer: Self-pay

## 2024-09-02 NOTE — Telephone Encounter (Signed)
 Spoke with patient, scheduled EGD/DIL for 09/27/2024 at 9:00am. Sending instructions through [place].

## 2024-09-13 ENCOUNTER — Other Ambulatory Visit: Payer: Self-pay | Admitting: Cardiology

## 2024-09-13 DIAGNOSIS — I25118 Atherosclerotic heart disease of native coronary artery with other forms of angina pectoris: Secondary | ICD-10-CM

## 2024-09-13 DIAGNOSIS — I10 Essential (primary) hypertension: Secondary | ICD-10-CM

## 2024-09-27 ENCOUNTER — Ambulatory Visit (HOSPITAL_COMMUNITY)

## 2024-09-27 ENCOUNTER — Ambulatory Visit (HOSPITAL_COMMUNITY)
Admission: RE | Admit: 2024-09-27 | Discharge: 2024-09-27 | Disposition: A | Discharge status: Home / Self Care | Attending: Gastroenterology | Admitting: Gastroenterology

## 2024-09-27 ENCOUNTER — Encounter (HOSPITAL_COMMUNITY): Payer: Self-pay | Admitting: Gastroenterology

## 2024-09-27 ENCOUNTER — Encounter (HOSPITAL_COMMUNITY)
Admission: RE | Disposition: A | Discharge status: Home / Self Care | Payer: Self-pay | Source: Home / Self Care | Attending: Gastroenterology

## 2024-09-27 ENCOUNTER — Other Ambulatory Visit: Payer: Self-pay

## 2024-09-27 DIAGNOSIS — R109 Unspecified abdominal pain: Secondary | ICD-10-CM | POA: Diagnosis not present

## 2024-09-27 DIAGNOSIS — I10 Essential (primary) hypertension: Secondary | ICD-10-CM | POA: Diagnosis not present

## 2024-09-27 DIAGNOSIS — K449 Diaphragmatic hernia without obstruction or gangrene: Secondary | ICD-10-CM

## 2024-09-27 DIAGNOSIS — E782 Mixed hyperlipidemia: Secondary | ICD-10-CM | POA: Diagnosis not present

## 2024-09-27 DIAGNOSIS — K222 Esophageal obstruction: Secondary | ICD-10-CM | POA: Diagnosis not present

## 2024-09-27 DIAGNOSIS — Z87891 Personal history of nicotine dependence: Secondary | ICD-10-CM

## 2024-09-27 DIAGNOSIS — Z955 Presence of coronary angioplasty implant and graft: Secondary | ICD-10-CM | POA: Insufficient documentation

## 2024-09-27 DIAGNOSIS — I2510 Atherosclerotic heart disease of native coronary artery without angina pectoris: Secondary | ICD-10-CM

## 2024-09-27 DIAGNOSIS — K3189 Other diseases of stomach and duodenum: Secondary | ICD-10-CM

## 2024-09-27 DIAGNOSIS — Z7982 Long term (current) use of aspirin: Secondary | ICD-10-CM | POA: Insufficient documentation

## 2024-09-27 DIAGNOSIS — K2970 Gastritis, unspecified, without bleeding: Secondary | ICD-10-CM

## 2024-09-27 DIAGNOSIS — R1310 Dysphagia, unspecified: Secondary | ICD-10-CM | POA: Diagnosis not present

## 2024-09-27 DIAGNOSIS — I25119 Atherosclerotic heart disease of native coronary artery with unspecified angina pectoris: Secondary | ICD-10-CM | POA: Insufficient documentation

## 2024-09-27 DIAGNOSIS — Z79899 Other long term (current) drug therapy: Secondary | ICD-10-CM | POA: Diagnosis not present

## 2024-09-27 DIAGNOSIS — R1013 Epigastric pain: Secondary | ICD-10-CM | POA: Diagnosis not present

## 2024-09-27 HISTORY — PX: ESOPHAGOGASTRODUODENOSCOPY: SHX5428

## 2024-09-27 HISTORY — PX: ESOPHAGEAL DILATION: SHX303

## 2024-09-27 SURGERY — EGD (ESOPHAGOGASTRODUODENOSCOPY)
Anesthesia: General

## 2024-09-27 MED ORDER — LACTATED RINGERS IV SOLN: INTRAVENOUS | Status: DC | PRN

## 2024-09-27 MED ORDER — LIDOCAINE 2% (20 MG/ML) 5 ML SYRINGE
INTRAMUSCULAR | Status: DC | PRN
  Administered 2024-09-27: 100 mg via INTRAVENOUS

## 2024-09-27 MED ORDER — PROPOFOL 10 MG/ML IV BOLUS
INTRAVENOUS | Status: DC | PRN
  Administered 2024-09-27: 20 mg via INTRAVENOUS
  Administered 2024-09-27: 80 mg via INTRAVENOUS
  Administered 2024-09-27: 125 ug/kg/min via INTRAVENOUS

## 2024-09-27 MED ORDER — LACTATED RINGERS IV SOLN
INTRAVENOUS | Status: DC
Start: 2024-09-27 — End: 2024-09-27

## 2024-09-27 NOTE — H&P (Signed)
 Primary Care Physician:  Maree Isles, MD Primary Gastroenterologist:  Dr. Cinderella  Pre-Procedure History & Physical: HPI: Kristy Hall is a 68 y.o. female coronary artery disease status post PCI on aspirin , hypertension, hyperlipidemia, chronic tobacco user who presents for evaluation of Epigastric pain, solid food dysphagia   Patient reports since past 4 weeks she has epigastric burning sensation which is constantly present reports no relieving factors that may alleviate with movement.  Patient denies daily NSAID use but takes naproxen intermittently.  For knee pain she usually takes Tylenol .  Patient does report solid food dysphagia for many months where she would cut her food into small pieces,, have occasional regurgitation of food.  Patient has occasional hard stools with straining. The patient denies having any nausea, vomiting, fever, chills, hematochezia, melena, hematemesis,diarrhea, jaundice, pruritus or weight loss.   Labs reviewed without any anemia or elevated liver enzymes   As per patient she had a Cologuard test done 3 weeks ago which is negative   Last EGD:20 years ago  Last Colonoscopy:10+ years ago   FHx: neg for any gastrointestinal/liver disease, no malignancies Social: Daily tobacco smoker Surgical: Hysterectomy,  Past Medical History:  Diagnosis Date   Hypertension    Mixed hyperlipidemia 11/05/2019   SCCA (squamous cell carcinoma) of skin 10/18/2020   Right Lower Leg-Anterior (well diff)    SCCA (squamous cell carcinoma) of skin 05/29/2022   Mid Supratip of Nose (in situ) (tx p bx)   Tobacco use disorder 11/05/2019    Past Surgical History:  Procedure Laterality Date   ABDOMINAL HYSTERECTOMY     BLADDER SUSPENSION     CARPAL TUNNEL RELEASE Bilateral    COLONOSCOPY     CORONARY STENT INTERVENTION N/A 11/05/2019   Procedure: CORONARY STENT INTERVENTION;  Surgeon: Ladona Heinz, MD;  Location: [facility];  Service: Cardiovascular;  Laterality: N/A;    HEMORROIDECTOMY     LEFT HEART CATH AND CORONARY ANGIOGRAPHY N/A 11/05/2019   Procedure: LEFT HEART CATH AND CORONARY ANGIOGRAPHY;  Surgeon: Ladona Heinz, MD;  Location: [facility];  Service: Cardiovascular;  Laterality: N/A;    Prior to Admission medications   Medication Sig Start Date End Date Taking? Authorizing Provider  acetaminophen  (TYLENOL ) 325 MG tablet Take 2 tablets (650 mg total) by mouth every 4 (four) hours as needed for headache or mild pain. 11/06/19  Yes Ladona Heinz, MD  Ascorbic Acid (VITAMIN C PO) Take by mouth.   Yes [provider]  atorvastatin  (LIPITOR) 80 MG tablet TAKE 1 TABLET BY MOUTH ONCE  DAILY AT 6 PM 05/08/23  Yes Ladona Heinz, MD  Calcium  Carb-Cholecalciferol (CALCIUM -VITAMIN D PO) Take by mouth.   Yes [provider]  losartan  (COZAAR ) 100 MG tablet Take 1 tablet (100 mg total) by mouth daily after supper. 09/14/24  Yes Ladona Heinz, MD  metoprolol  succinate (TOPROL -XL) 50 MG 24 hr tablet Take 1 tablet (50 mg total) by mouth daily. Patient taking differently: Take 25 mg by mouth daily. 12/26/21  Yes Ladona Heinz, MD  pantoprazole (PROTONIX) 40 MG tablet Take by mouth. 07/28/24  Yes [provider]  aspirin  (ASPIRIN  CHILDRENS) 81 MG chewable tablet Chew 1 tablet (81 mg total) by mouth daily. 12/26/20   Ladona Heinz, MD  LORazepam (ATIVAN) 0.5 MG tablet Take 0.5 mg by mouth as needed.  11/15/19   [provider]    Allergies as of 09/02/2024   (No Known Allergies)    Family History  Problem Relation Age of  Onset   COPD Mother    Basal cell carcinoma Mother    COPD Brother    Breast cancer Neg Hx     Social History   Socioeconomic History   Marital status: Widowed    Spouse name: Not on file   Number of children: 3   Years of education: Not on file   Highest education level: Not on file  Occupational History   Not on file  Tobacco Use   Smoking status: Former    Current packs/day: 0.00    Average packs/day: 0.5  packs/day for 15.0 years (7.5 ttl pk-yrs)    Types: Cigarettes    Start date: 04/2005    Quit date: 04/2020    Years since quitting: 4.4   Smokeless tobacco: Never  Vaping Use   Vaping status: Former  Substance and Sexual Activity   Alcohol use: Yes    Comment: occasional   Drug use: No   Sexual activity: Not on file  Other Topics Concern   Not on file  Social History Narrative   Not on file   Social Drivers of Health   Financial Resource Strain: Not on file  Food Insecurity: Not on file  Transportation Needs: Not on file  Physical Activity: Not on file  Stress: Not on file  Social Connections: Not on file  Intimate Partner Violence: Not on file    Review of Systems: See HPI, otherwise negative ROS  Physical Exam: Vital signs in last 24 hours: Pulse Rate:  [54] 54 (10/06 0753) Resp:  [16] 16 (10/06 0753) BP: (145)/(70) 145/70 (10/06 0753) SpO2:  [98 %] 98 % (10/06 0753) Weight:  [61.2 kg] 61.2 kg (10/06 0753)   General:   Alert,  Well-developed, well-nourished, pleasant and cooperative in NAD Head:  Normocephalic and atraumatic. Eyes:  Sclera clear, no icterus.   Conjunctiva pink. Ears:  Normal auditory acuity. Nose:  No deformity, discharge,  or lesions. Msk:  Symmetrical without gross deformities. Normal posture. Extremities:  Without clubbing or edema. Neurologic:  Alert and  oriented x4;  grossly normal neurologically. Skin:  Intact without significant lesions or rashes. Psych:  Alert and cooperative. Normal mood and affect.  Impression/Plan: Kristy Hall is a 68 y.o. female coronary artery disease status post PCI on aspirin , hypertension, hyperlipidemia, chronic tobacco user who presents for evaluation of Epigastric pain, solid food dysphagia  Proceed with EGD with dilation/biopsies   The risks of the procedure including infection, bleed, or perforation as well as benefits, limitations, alternatives and imponderables have been reviewed with the patient.  Questions have been answered. All parties agreeable.

## 2024-09-27 NOTE — Discharge Instructions (Signed)

## 2024-09-27 NOTE — Addendum Note (Signed)
 Addendum  created 09/27/24 1424 by Para Jerelene CROME, CRNA   Clinical Note Signed

## 2024-09-27 NOTE — Op Note (Signed)
 [facility] Patient Name: Kristy Hall Procedure Date: 09/27/2024 8:40 AM MRN: [id number] Date of Birth: 1956/09/07 Attending MD: Deatrice Dine , MD, [id number] CSN: [id number] Age: 68 Admit Type: Outpatient Procedure:                Upper GI endoscopy Indications:              Epigastric abdominal pain, Dysphagia Providers:                Deatrice Dine, MD, Leandrew Edelman RN, RN, Bascom Blush Referring MD:              Medicines:                Monitored Anesthesia Care Complications:            No immediate complications. Estimated Blood Loss:     Estimated blood loss was minimal. Procedure:                Pre-Anesthesia Assessment:                           - Prior to the procedure, a History and Physical                            was performed, and patient medications and                            allergies were reviewed. The patient's tolerance of                            previous anesthesia was also reviewed. The risks                            and benefits of the procedure and the sedation                            options and risks were discussed with the patient.                            All questions were answered, and informed consent                            was obtained. Prior Anticoagulants: The patient has                            taken no anticoagulant or antiplatelet agents. ASA                            Grade Assessment: II - A patient with mild systemic                            disease. After reviewing the risks and benefits,                            the patient was deemed in satisfactory condition to  undergo the procedure.                           After obtaining informed consent, the endoscope was                            passed under direct vision. Throughout the                            procedure, the patient's blood pressure, pulse, and                            oxygen saturations were monitored  continuously. The                            [id number]-[id number] ([id number]) Upper was introduced through                            the mouth, and advanced to the second part of                            duodenum. The upper GI endoscopy was accomplished                            without difficulty. The patient tolerated the                            procedure well. Scope In: 8:57:58 AM Scope Out: 9:08:58 AM Total Procedure Duration: 0 hours 11 minutes 0 seconds  Findings:      A low-grade of narrowing Schatzki ring was found in the lower third of       the esophagus. A TTS dilator was passed through the scope. Dilation with       a 15-16.5-18 mm balloon dilator was performed to 18 mm. The dilation       site was examined following endoscope reinsertion and showed moderate       mucosal disruption. Biopsies were obtained from the proximal and distal       esophagus with cold forceps for histology of suspected eosinophilic       esophagitis.      Mildly erythematous mucosa without bleeding was found in the gastric       antrum. Biopsies were taken with a cold forceps for histology.      A 2 cm hiatal hernia was present.      The duodenal bulb and second portion of the duodenum were normal.       Biopsies were taken with a cold forceps for histology. Impression:               - Low-grade of narrowing Schatzki ring. Dilated.                           - Erythematous mucosa in the antrum. Biopsied.                           - 2 cm hiatal hernia.                           -  Normal duodenal bulb and second portion of the                            duodenum. Biopsied.                           - Biopsies were taken with a cold forceps for                            evaluation of eosinophilic esophagitis. Moderate Sedation:      Per Anesthesia Care Recommendation:           - Patient has a contact number available for                            emergencies. The signs and symptoms of potential                             delayed complications were discussed with the                            patient. Return to normal activities tomorrow.                            Written discharge instructions were provided to the                            patient.                           - Resume previous diet.                           - Continue present medications.                           - Await pathology results.                           - Repeat upper endoscopy.                           - Return to GI clinic as previously scheduled.                           -Obtain HIDA scan , suspected gallbladder disease Procedure Code(s):        --- Professional ---                           [phone], Esophagogastroduodenoscopy, flexible,                            transoral; with transendoscopic balloon dilation of                            esophagus (less than 30 mm diameter)  [id number], 59, Esophagogastroduodenoscopy, flexible,                            transoral; with biopsy, single or multiple Diagnosis Code(s):        --- Professional ---                           K22.2, Esophageal obstruction                           K31.89, Other diseases of stomach and duodenum                           K44.9, Diaphragmatic hernia without obstruction or                            gangrene                           R10.13, Epigastric pain                           R13.10, Dysphagia, unspecified CPT copyright 2022 American Medical Association. All rights reserved. The codes documented in this report are preliminary and upon coder review may  be revised to meet current compliance requirements. Deatrice Dine, MD Deatrice Dine, MD 09/27/2024 9:15:16 AM This report has been signed electronically. Number of Addenda: 0

## 2024-09-27 NOTE — Transfer of Care (Addendum)
 Immediate Anesthesia Transfer of Care Note  Patient: Kristy Hall Stage  Procedure(s) Performed: EGD (ESOPHAGOGASTRODUODENOSCOPY) DILATION, ESOPHAGUS  Patient Location: Endoscopy Unit  Anesthesia Type:General  Level of Consciousness: drowsy and patient cooperative  Airway & Oxygen Therapy: Patient Spontanous Breathing  Post-op Assessment: Report given to RN and Post -op Vital signs reviewed and stable  Post vital signs: Reviewed and stable  Last Vitals:  Vitals Value Taken Time  BP 115/72 09/27/24   0913  Temp 36.6 09/27/24   0913  Pulse 67 09/27/24   0913  Resp 12 09/27/24   0913  SpO2 95% 09/27/24   0913    Last Pain:  Vitals:   09/27/24 0851  TempSrc:   PainSc: 0-No pain      Patients Stated Pain Goal: 10 (09/27/24 0753)  Complications: No notable events documented.

## 2024-09-27 NOTE — Anesthesia Procedure Notes (Signed)
 Date/Time: 09/27/2024 8:51 AM  Performed by: Para Jerelene CROME, CRNAOxygen Delivery Method: Nasal cannula Comments: OptiFlow Nasal Cannula.

## 2024-09-27 NOTE — Anesthesia Preprocedure Evaluation (Addendum)
 Anesthesia Evaluation  Patient identified by MRN, date of birth, ID band Patient awake    Reviewed: Allergy & Precautions, H&P , NPO status , Patient's Chart, lab work & pertinent test results  Airway Mallampati: III  TM Distance: >3 FB Neck ROM: Full    Dental no notable dental hx.    Pulmonary former smoker   Pulmonary exam normal breath sounds clear to auscultation       Cardiovascular hypertension, + angina  + CAD  Normal cardiovascular exam Rhythm:Regular Rate:Normal  Cath 2020 stent Nl EF   Neuro/Psych negative neurological ROS  negative psych ROS   GI/Hepatic negative GI ROS, Neg liver ROS,,,  Endo/Other  negative endocrine ROS    Renal/GU negative Renal ROS  negative genitourinary   Musculoskeletal negative musculoskeletal ROS (+)    Abdominal   Peds negative pediatric ROS (+)  Hematology negative hematology ROS (+)   Anesthesia Other Findings   Reproductive/Obstetrics negative OB ROS                              Anesthesia Physical Anesthesia Plan  ASA: 3  Anesthesia Plan: General   Post-op Pain Management:    Induction: Intravenous  PONV Risk Score and Plan:   Airway Management Planned: Nasal Cannula  Additional Equipment:   Intra-op Plan:   Post-operative Plan:   Informed Consent: I have reviewed the patients History and Physical, chart, labs and discussed the procedure including the risks, benefits and alternatives for the proposed anesthesia with the patient or authorized representative who has indicated his/her understanding and acceptance.     Dental advisory given  Plan Discussed with: CRNA  Anesthesia Plan Comments:          Anesthesia Quick Evaluation

## 2024-09-27 NOTE — Anesthesia Postprocedure Evaluation (Signed)
 Anesthesia Post Note  Patient: Kristy Hall  Procedure(s) Performed: EGD (ESOPHAGOGASTRODUODENOSCOPY) DILATION, ESOPHAGUS  Patient location during evaluation: PACU Anesthesia Type: General Level of consciousness: awake and alert Pain management: pain level controlled Vital Signs Assessment: post-procedure vital signs reviewed and stable Respiratory status: spontaneous breathing, nonlabored ventilation, respiratory function stable and patient connected to nasal cannula oxygen Cardiovascular status: blood pressure returned to baseline and stable Postop Assessment: no apparent nausea or vomiting Anesthetic complications: no   No notable events documented.   Last Vitals:  Vitals:   09/27/24 0753 09/27/24 0913  BP: (!) 145/70 115/72  Pulse: (!) 54 66  Resp: 16 12  Temp:  36.6 C  SpO2: 98% 95%    Last Pain:  Vitals:   09/27/24 0913  TempSrc: Axillary  PainSc: 0-No pain                 Andrea Limes

## 2024-09-28 LAB — SURGICAL PATHOLOGY

## 2024-09-29 ENCOUNTER — Telehealth (INDEPENDENT_AMBULATORY_CARE_PROVIDER_SITE_OTHER): Payer: Self-pay

## 2024-09-29 NOTE — Telephone Encounter (Signed)
 PA for HIDA on [facility]: CPT Code 21772     Description: [facility] Authorization Number: [id number] Case Number: [id number] Review Date: 09/29/2024 10:45:47 AM Expiration Date: 11/13/2024 Status: Your case has been Approved.

## 2024-09-29 NOTE — Telephone Encounter (Signed)
 Spoke with patient, she is aware of her HIDA appt on 10/04/2024 at 8am.

## 2024-09-30 ENCOUNTER — Ambulatory Visit (INDEPENDENT_AMBULATORY_CARE_PROVIDER_SITE_OTHER): Payer: Self-pay | Admitting: Gastroenterology

## 2024-10-01 ENCOUNTER — Encounter (HOSPITAL_COMMUNITY): Payer: Self-pay | Admitting: Gastroenterology

## 2024-10-04 ENCOUNTER — Encounter (HOSPITAL_COMMUNITY)
Admission: RE | Admit: 2024-10-04 | Discharge: 2024-10-04 | Disposition: A | Discharge status: Home / Self Care | Source: Ambulatory Visit | Attending: Gastroenterology | Admitting: Gastroenterology

## 2024-10-04 DIAGNOSIS — R109 Unspecified abdominal pain: Secondary | ICD-10-CM | POA: Diagnosis not present

## 2024-10-04 DIAGNOSIS — R1013 Epigastric pain: Secondary | ICD-10-CM | POA: Diagnosis not present

## 2024-10-04 MED ORDER — TECHNETIUM TC 99M MEBROFENIN IV KIT
5.0000 | PACK | Freq: Once | INTRAVENOUS | Status: AC | PRN
  Administered 2024-10-04: 5.1 via INTRAVENOUS

## 2024-10-05 ENCOUNTER — Telehealth (INDEPENDENT_AMBULATORY_CARE_PROVIDER_SITE_OTHER): Payer: Self-pay | Admitting: Gastroenterology

## 2024-10-05 NOTE — Telephone Encounter (Signed)
 Pt left voicemail stating she had a HIDA scan completed yesterday and results are in her chart and she does not understand them. Returned call to pt and informed her that Dr.Ahmed is out of the office for the week but I will send this to the covering provider. Please advise. Thank you!

## 2024-10-06 NOTE — Telephone Encounter (Signed)
 Pt contacted and verbalized understanding. Pt asked what exactly does over functioning of the gallbladder mean; advised pt that it basically means the gallbladder is contracting more than normal and that causes bile to be emptied quicker than normal.

## 2024-10-08 NOTE — Progress Notes (Signed)
 Patient result letter mailed procedure note and pathology result faxed to PCP

## 2024-10-08 NOTE — Telephone Encounter (Signed)
 Pt left voicemail stating she was having bad abdominal pain and gallbladder issues and wanted to know if she should be taking any medication. Pt contacted. Pt states pain in the upper middle to right area of abdomen, bloating, and diarrhea. Pt states she is not taking anything for pain at this time because she wasn't sure she should and she didn't want to cover up any issues that may be serious. Advised pt she should go to ER if her abdominal pain is worsening. Pt verbalized understanding. Advised pt we would be in touch on Monday with further recommendations from provider.

## 2024-10-11 NOTE — Telephone Encounter (Signed)
 Pt left voicemail checking to see if Dr.Ahmed had looked over results of HIDA scan from last week. Please advise. Thank you!

## 2024-10-12 ENCOUNTER — Telehealth (INDEPENDENT_AMBULATORY_CARE_PROVIDER_SITE_OTHER): Payer: Self-pay | Admitting: Gastroenterology

## 2024-10-12 NOTE — Progress Notes (Signed)
 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 I do recommend seeing the surgeons for this given your continues symptoms . I will send in a  referral   =======  Hi Kristy Hall ,  Can you please arrange a referral for this patient to our surgeons  Diagnosis: Abdominal pain , gallbladder hyperkinesia    Thanks,  Suraj Ramdass Faizan Robson Trickey, MD Gastroenterology and Hepatology [facility]  =============  HIDA  IMPRESSION: 1.  Patent cystic and common bile ducts.   2. Elevated gallbladder ejection fraction as can be seen with gallbladder hyperkinesia.

## 2024-10-12 NOTE — Progress Notes (Signed)
 Referral sent, they will contact patient with apt

## 2024-10-12 NOTE — Telephone Encounter (Signed)
 Pt called again today asking if she needed to make an appointment to discuss her HIDA scan results with Dr Cinderella. She is already scheduled a 4 month follow up in December with [facility]. Please advise. [phone] (I transferred call to nurse's VM)

## 2024-10-12 NOTE — Telephone Encounter (Signed)
 Pt has called in multiple times Please advise. Thank you

## 2024-10-13 ENCOUNTER — Other Ambulatory Visit: Payer: Self-pay | Admitting: Cardiology

## 2024-10-13 DIAGNOSIS — I10 Essential (primary) hypertension: Secondary | ICD-10-CM

## 2024-10-13 DIAGNOSIS — I25118 Atherosclerotic heart disease of native coronary artery with other forms of angina pectoris: Secondary | ICD-10-CM

## 2024-10-26 ENCOUNTER — Telehealth (HOSPITAL_BASED_OUTPATIENT_CLINIC_OR_DEPARTMENT_OTHER): Payer: Self-pay

## 2024-10-26 ENCOUNTER — Ambulatory Visit: Admitting: Surgery

## 2024-10-26 ENCOUNTER — Encounter: Payer: Self-pay | Admitting: Surgery

## 2024-10-26 ENCOUNTER — Encounter: Payer: Self-pay | Admitting: *Deleted

## 2024-10-26 VITALS — BP 155/87 | HR 66 | Temp 97.7°F | Resp 16 | Ht 60.0 in | Wt 138.0 lb

## 2024-10-26 DIAGNOSIS — K828 Other specified diseases of gallbladder: Secondary | ICD-10-CM | POA: Diagnosis not present

## 2024-10-26 DIAGNOSIS — I25118 Atherosclerotic heart disease of native coronary artery with other forms of angina pectoris: Secondary | ICD-10-CM | POA: Diagnosis not present

## 2024-10-26 NOTE — Progress Notes (Unsigned)
 [facility] History and Physical  Reason for Referral:*** Referring Physician: ***  Chief Complaint   New Patient (Initial Visit)     Kristy STAFF is a 68 y.o. female.  HPI: ***.  The *** started *** and has had a duration of ***.  It is associated with ***.  The *** is improved with ***, and is made worse with ***.    Quality*** Context***  Past Medical History:  Diagnosis Date  . Hypertension   . Mixed hyperlipidemia 11/05/2019  . SCCA (squamous cell carcinoma) of skin 10/18/2020   Right Lower Leg-Anterior (well diff)   . SCCA (squamous cell carcinoma) of skin 05/29/2022   Mid Supratip of Nose (in situ) (tx p bx)  . Tobacco use disorder 11/05/2019    Past Surgical History:  Procedure Laterality Date  . ABDOMINAL HYSTERECTOMY    . BLADDER SUSPENSION    . CARPAL TUNNEL RELEASE Bilateral   . COLONOSCOPY    . CORONARY STENT INTERVENTION N/A 11/05/2019   Procedure: CORONARY STENT INTERVENTION;  Surgeon: Ladona Heinz, MD;  Location: [facility] INVASIVE CV LAB;  Service: Cardiovascular;  Laterality: N/A;  . ESOPHAGEAL DILATION N/A 09/27/2024   Procedure: DILATION, ESOPHAGUS;  Surgeon: Cinderella Deatrice FALCON, MD;  Location: [facility] ENDO SUITE;  Service: Endoscopy;  Laterality: N/A;  . ESOPHAGOGASTRODUODENOSCOPY N/A 09/27/2024   Procedure: EGD (ESOPHAGOGASTRODUODENOSCOPY);  Surgeon: Cinderella Deatrice FALCON, MD;  Location: [facility] ENDO SUITE;  Service: Endoscopy;  Laterality: N/A;  9:00am, ASA 1-2  . HEMORROIDECTOMY    . LEFT HEART CATH AND CORONARY ANGIOGRAPHY N/A 11/05/2019   Procedure: LEFT HEART CATH AND CORONARY ANGIOGRAPHY;  Surgeon: Ladona Heinz, MD;  Location: [facility] INVASIVE CV LAB;  Service: Cardiovascular;  Laterality: N/A;    Family History  Problem Relation Age of Onset  . COPD Mother   . Basal cell carcinoma Mother   . COPD Brother   . Breast cancer Neg Hx     Social History   Tobacco Use  . Smoking status: Former    Current packs/day: 0.00    Average packs/day: 0.5  packs/day for 15.0 years (7.5 ttl pk-yrs)    Types: Cigarettes    Start date: 04/2005    Quit date: 04/2020    Years since quitting: 4.5  . Smokeless tobacco: Never  Vaping Use  . Vaping status: Former  Substance Use Topics  . Alcohol use: Yes    Comment: occasional  . Drug use: No    Medications: {medication reviewed/display:3041432} Allergies as of 10/26/2024   No Known Allergies      Medication List        Accurate as of October 26, 2024  9:52 AM. If you have any questions, ask your nurse or doctor.          acetaminophen  325 MG tablet Commonly known as: TYLENOL  Take 2 tablets (650 mg total) by mouth every 4 (four) hours as needed for headache or mild pain.   aspirin  81 MG chewable tablet Commonly known as: Aspirin  Childrens Chew 1 tablet (81 mg total) by mouth daily.   atorvastatin  80 MG tablet Commonly known as: LIPITOR TAKE 1 TABLET BY MOUTH ONCE  DAILY AT 6 PM   CALCIUM -VITAMIN D PO Take by mouth.   LORazepam 0.5 MG tablet Commonly known as: ATIVAN Take 0.5 mg by mouth as needed.   losartan  100 MG tablet Commonly known as: COZAAR  TAKE 1 TABLET BY MOUTH DAILY  AFTER SUPPER   metoprolol  succinate 50 MG 24 hr tablet  Commonly known as: TOPROL -XL Take 1 tablet (50 mg total) by mouth daily. What changed: how much to take   pantoprazole 40 MG tablet Commonly known as: PROTONIX Take by mouth.   VITAMIN C PO Take by mouth.         ROS:  {Review of Systems:30496}  Blood pressure (!) 155/87, pulse 66, temperature 97.7 F (36.5 C), temperature source Oral, resp. rate 16, height 5' (1.524 m), weight 138 lb (62.6 kg), SpO2 95%. Physical Exam  Results: No results found for this or any previous visit (from the past 48 hours).  No results found.   Assessment & Plan:  Kristy Hall is a 68 y.o. female with *** -*** -*** -Follow up ***  All questions were answered to the satisfaction of the patient and family***.  The risk and benefits of  *** were discussed including but not limited to ***.  After careful consideration, Kristy Hall has decided to ***.    Kristy Hall A Faren Kristy Hall 10/26/2024, 9:52 AM   Note: Portions of this report may have been transcribed using voice recognition software. Every effort has been made to ensure accuracy; however, inadvertent computerized transcription errors may still be present.   Kristy Brittle, DO [facility] [address] [place], [place] [address] [phone] (office)

## 2024-10-26 NOTE — Telephone Encounter (Signed)
   Name: Kristy Hall  DOB: 10-15-1956  MRN: [id number]  Primary Cardiologist: Gordy Bergamo, MD  Chart reviewed as part of pre-operative protocol coverage. Because of Kristy Hall past medical history and time since last visit, she will require a follow-up in-office visit in order to better assess preoperative cardiovascular risk.  Pre-op covering staff: - Please schedule appointment and call patient to inform them. If patient already had an upcoming appointment within acceptable timeframe, please add pre-op clearance to the appointment notes so provider is aware. - Please contact requesting surgeon's office via preferred method (i.e, phone, fax) to inform them of need for appointment prior to surgery.  Pending no new cardiac symptoms per protocol, ok to hold Aspirin  for procedure for 5-7 days prior to procedure and should resume as soon as hemodynamically stable as the direction of the surgeon.    [name], NP  10/26/2024, 3:10 PM

## 2024-10-26 NOTE — Telephone Encounter (Signed)
   Pre-operative Risk Assessment    Patient Name: Kristy Hall  DOB: [birth year] MRN: [id number]   Date of last office visit: 12/26/2022 - Dr. Ladona Date of next office visit: N/A  Request for Surgical Clearance    Procedure:  XI ROBOTIC ASSISTED LAPAROSCOPIC CHOLECYSTECTOMY   Date of Surgery:  Clearance TBD                                 Surgeon:  [name], DO  Surgeon's Group or Practice Name:  [facility]  Phone number:  [phone] Fax number:  [phone]   Type of Clearance Requested:   - Medical  - Pharmacy:  Hold Aspirin  - Not specified   Type of Anesthesia:  General    Additional requests/questions:  type pf clearance needed: Coronary artery disease of native artery of native heart with stable angina pectoris, Primary HTN, Unstable angina, Unstable angina pectoris, Mixed hyperlipidemia  Signed, Patrcia Iverson CROME   10/26/2024, 2:25 PM

## 2024-10-26 NOTE — Telephone Encounter (Signed)
 Attempted to reach the patient for pre-op clearance, but mailbox was full. Will try again later.

## 2024-10-28 NOTE — Telephone Encounter (Signed)
 Office visit pre op clearance has been scheduled.

## 2024-11-03 ENCOUNTER — Other Ambulatory Visit (HOSPITAL_COMMUNITY)

## 2024-11-05 ENCOUNTER — Encounter: Payer: Self-pay | Admitting: Physician Assistant

## 2024-11-05 ENCOUNTER — Ambulatory Visit: Attending: Physician Assistant | Admitting: Physician Assistant

## 2024-11-05 VITALS — BP 128/59 | HR 57 | Ht 60.0 in | Wt 139.6 lb

## 2024-11-05 DIAGNOSIS — R0989 Other specified symptoms and signs involving the circulatory and respiratory systems: Secondary | ICD-10-CM

## 2024-11-05 DIAGNOSIS — F17200 Nicotine dependence, unspecified, uncomplicated: Secondary | ICD-10-CM | POA: Diagnosis not present

## 2024-11-05 DIAGNOSIS — I25118 Atherosclerotic heart disease of native coronary artery with other forms of angina pectoris: Secondary | ICD-10-CM

## 2024-11-05 DIAGNOSIS — I10 Essential (primary) hypertension: Secondary | ICD-10-CM

## 2024-11-05 DIAGNOSIS — E782 Mixed hyperlipidemia: Secondary | ICD-10-CM

## 2024-11-05 NOTE — Patient Instructions (Signed)
 Medication Instructions:    Your physician recommends that you continue on your current medications as directed. Please refer to the Current Medication list given to you today.    *If you need a refill on your cardiac medications before your next appointment, please call your pharmacy*   Lab Work:  PLEASE GO DOWN STAIRS  [facility]  FIRST FLOOR   ( GET OFF ELEVATORS WALK TOWARDS WAITING AREA LAB LOCATED BY PHARMACY):  BMET AND MAG TODAY      If you have labs (blood work) drawn today and your tests are completely normal, you will receive your results only by: MyChart Message (if you have MyChart) OR A paper copy in the mail If you have any lab test that is abnormal or we need to change your treatment, we will call you to review the results.   Testing/Procedures:   Your physician has requested that you have a carotid duplex. This test is an ultrasound of the carotid arteries in your neck. It looks at blood flow through these arteries that supply the brain with blood. Allow one hour for this exam. There are no restrictions or special instructions.    Follow-Up: At [facility], you and your health needs are our priority.  As part of our continuing mission to provide you with exceptional heart care, our providers are all part of one team.  This team includes your primary Cardiologist (physician) and Advanced Practice Providers or APPs (Physician Assistants and Nurse Practitioners) who all work together to provide you with the care you need, when you need it.  Your next appointment:   6 month(s)   Provider:  ORREN FABRY    We recommend signing up for the patient portal called MyChart.  Sign up information is provided on this After Visit Summary.  MyChart is used to connect with patients for Virtual Visits (Telemedicine).  Patients are able to view lab/test results, encounter notes, upcoming appointments, etc.  Non-urgent messages can be sent to your provider as well.   To learn  more about what you can do with MyChart, go to [email or web address].   Other Instructions

## 2024-11-05 NOTE — Progress Notes (Signed)
 Cardiology Office Note   Date:  11/05/2024  ID:  ASJAH RAUDA, DOB [birth year], MRN [id number] PCP: Maree Isles, MD  [facility] Providers Cardiologist:  Gordy Bergamo, MD   History of Present Illness Kristy Hall is a 68 y.o. 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  At her last visit she did not have any angina or change in her chronic dyspnea, noticed occasional leg edema.  Denied symptoms of claudication or TIA.  Taking all medications as prescribed.  Today, she presents for cardiovascular follow-up.  Five years ago, she experienced a myocardial infarction with atypical symptoms such as right-sided pain and nausea. Currently, she has no recent cardiac symptoms and feels calm about her heart health.  She was informed of a carotid artery issue, with the last ultrasound performed approximately two years ago. She has not experienced any related symptoms.  Her blood pressure was previously high at a surgical visit.  She has a history of smoking.  Reports no shortness of breath nor dyspnea on exertion. Reports no chest pain, pressure, or tightness. No edema, orthopnea, PND. Reports no palpitations.   Discussed the use of AI scribe software for clinical note transcription with the patient, who gave verbal consent to proceed.   ROS: pertinent ROS in HPI  Studies Reviewed EKG Interpretation Date/Time:  Friday November 05 2024 13:51:04 EST Ventricular Rate:  57 PR Interval:  158 QRS Duration:  90 QT Interval:  418 QTC Calculation: 406 R Axis:   13  Text Interpretation: Sinus bradycardia RSR' or QR pattern in V1 suggests right ventricular conduction delay When compared with ECG of 06-Nov-2019 04:14, RSR' pattern in V1 is now Present Confirmed by Lucien Blanc [id number]) on 11/05/2024 2:29:50 PM     Cardiac catheterization 11/05/2019  Left Anterior Descending  There is mild diffuse disease throughout the vessel.    Left Circumflex  Prox Cx lesion is 95% stenosed. The lesion is type B2, concentric and ulcerative. The stenosis was measured by a visual reading.  Mid Cx lesion is 40% stenosed.    First Obtuse Marginal Branch  1st Mrg lesion is 80% stenosed.    Right Coronary Artery  Prox RCA lesion is 40% stenosed.  Mid RCA to Dist RCA lesion is 20% stenosed.    Intervention   Prox Cx lesion  Angioplasty  Lesion length: 12 mm. CATHETER LAUNCHER 6FR EBU3.5 guide catheter was inserted. WIRE COUGAR XT STRL 190CM guidewire used to cross lesion. Balloon angioplasty was performed using a BALLOON EMERGE MR 2.5X15. Maximum pressure: 14 atm. Inflation time: 60 sec.  Stent  A drug-eluting stent was successfully placed using a STENT RESOLUTE ONYX 2.5X18. Maximum pressure: 12 atm. Inflation time: 60 sec. Stent strut is well apposed. Post-stent angioplasty was not performed.  Post-Intervention Lesion Assessment  The intervention was successful. Pre-interventional TIMI flow is 3. Post-intervention TIMI flow is 3. No complications occurred at this lesion.  There is a 0% residual stenosis post intervention.       Diagnostic Dominance: Right  Intervention    Implants      Physical Exam VS:  BP (!) 128/59   Pulse (!) 57   Ht 5' (1.524 m)   Wt 139 lb 9.6 oz (63.3 kg)   SpO2 96%   BMI 27.26 kg/m        Wt Readings from Last 3 Encounters:  11/05/24  139 lb 9.6 oz (63.3 kg)  10/26/24 138 lb (62.6 kg)  09/27/24 135 lb (61.2 kg)    GEN: Well nourished, well developed in no acute distress NECK: No JVD; + Lcarotid bruit CARDIAC: RRR, no murmurs, rubs, gallops RESPIRATORY:  Clear to auscultation without rales, wheezing or rhonchi  ABDOMEN: Soft, non-tender, non-distended EXTREMITIES:  No edema; No deformity   ASSESSMENT AND PLAN Preop Eval  Ms. Hanna's perioperative risk of a  major cardiac event is 6.6% according to the Revised Cardiac Risk Index (RCRI).  Therefore, she is at  risk for perioperative complications.   Her functional capacity is good at 5.62 METs according to the Duke Activity Status Index (DASI). Recommendations: According to ACC/AHA guidelines, no further cardiovascular testing needed.  The patient may proceed to surgery at acceptable risk.   Antiplatelet and/or Anticoagulation Recommendations: Aspirin  can be held for 5-7 days prior to her surgery.  Please resume Aspirin  post operatively when it is felt to be safe from a bleeding standpoint.   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 EKG stable. Bradycardia likely due to metoprolol , not concerning without symptoms. - Continue current cardiac medications. - Monitor heart rate and blood pressure regularly.  Left carotid artery stenosis, under evaluation Mild bruit on left side. Increased risk due to smoking history. Last ultrasound two years ago. - Ordered carotid ultrasound to assess for significant stenosis. - Consider vascular surgery referral for endarterectomy if significant stenosis is found.  Nocturnal leg and foot cramps, under evaluation Severe nightly cramps. Possible electrolyte imbalance or nutrient deficiency. Previous labs missing magnesium levels. - Ordered magnesium level to assess for deficiency. - Consider magnesium supplement (400 mg) if low.  Nicotine  dependence Smoking increases risk for carotid disease and affects surgical recovery.  Essential hypertension Blood pressure well-controlled at 128/59 mmHg with current management. - Continue current antihypertensive regimen. - Monitor blood pressure regularly.      Dispo: She can follow-up with me or MD in 6 months  Signed, Orren LOISE Fabry, PA-C

## 2024-11-06 LAB — MAGNESIUM: Magnesium: 2.1 mg/dL (ref 1.6–2.3)

## 2024-11-06 LAB — BASIC METABOLIC PANEL WITH GFR
BUN/Creatinine Ratio: 11 — ABNORMAL LOW (ref 12–28)
BUN: 7 mg/dL — ABNORMAL LOW (ref 8–27)
CO2: 23 mmol/L (ref 20–29)
Calcium: 10 mg/dL (ref 8.7–10.3)
Chloride: 102 mmol/L (ref 96–106)
Creatinine, Ser: 0.66 mg/dL (ref 0.57–1.00)
Glucose: 84 mg/dL (ref 70–99)
Potassium: 4.5 mmol/L (ref 3.5–5.2)
Sodium: 141 mmol/L (ref 134–144)
eGFR: 94 mL/min/1.73 (ref >59)

## 2024-11-07 ENCOUNTER — Ambulatory Visit: Payer: Self-pay | Admitting: Physician Assistant

## 2024-11-08 ENCOUNTER — Telehealth: Payer: Self-pay | Admitting: *Deleted

## 2024-11-08 DIAGNOSIS — K828 Other specified diseases of gallbladder: Secondary | ICD-10-CM

## 2024-11-08 NOTE — Telephone Encounter (Signed)
 Received cardiac clearance.   Procedure scheduled.

## 2024-11-09 NOTE — Telephone Encounter (Signed)
 Spoke with pt regarding lab results. Pt verbalized understanding. Pt also stated she is trying to hydrate more. Pt was advised to call our office if she has any questions.

## 2024-11-10 ENCOUNTER — Other Ambulatory Visit: Payer: Self-pay | Admitting: Cardiology

## 2024-11-10 ENCOUNTER — Ambulatory Visit (HOSPITAL_COMMUNITY)
Admission: RE | Admit: 2024-11-10 | Discharge: 2024-11-10 | Disposition: A | Discharge status: Home / Self Care | Source: Ambulatory Visit | Attending: Physician Assistant | Admitting: Physician Assistant

## 2024-11-10 DIAGNOSIS — I25118 Atherosclerotic heart disease of native coronary artery with other forms of angina pectoris: Secondary | ICD-10-CM | POA: Insufficient documentation

## 2024-11-10 DIAGNOSIS — R0989 Other specified symptoms and signs involving the circulatory and respiratory systems: Secondary | ICD-10-CM

## 2024-11-10 DIAGNOSIS — I10 Essential (primary) hypertension: Secondary | ICD-10-CM

## 2024-12-03 ENCOUNTER — Encounter (HOSPITAL_COMMUNITY): Payer: Self-pay

## 2024-12-03 ENCOUNTER — Other Ambulatory Visit: Payer: Self-pay

## 2024-12-03 ENCOUNTER — Encounter (HOSPITAL_COMMUNITY)
Admission: RE | Admit: 2024-12-03 | Discharge: 2024-12-03 | Disposition: A | Source: Ambulatory Visit | Attending: Surgery

## 2024-12-08 ENCOUNTER — Ambulatory Visit (HOSPITAL_COMMUNITY): Admission: RE | Admit: 2024-12-08 | Discharge: 2024-12-08 | Disposition: A | Attending: Surgery | Admitting: Surgery

## 2024-12-08 ENCOUNTER — Other Ambulatory Visit: Payer: Self-pay

## 2024-12-08 ENCOUNTER — Ambulatory Visit (HOSPITAL_COMMUNITY): Admitting: Anesthesiology

## 2024-12-08 ENCOUNTER — Encounter (HOSPITAL_COMMUNITY): Payer: Self-pay | Admitting: Surgery

## 2024-12-08 ENCOUNTER — Encounter: Admission: RE | Disposition: A | Payer: Self-pay | Attending: Surgery

## 2024-12-08 DIAGNOSIS — F17210 Nicotine dependence, cigarettes, uncomplicated: Secondary | ICD-10-CM

## 2024-12-08 DIAGNOSIS — I25119 Atherosclerotic heart disease of native coronary artery with unspecified angina pectoris: Secondary | ICD-10-CM

## 2024-12-08 DIAGNOSIS — Z90710 Acquired absence of both cervix and uterus: Secondary | ICD-10-CM | POA: Diagnosis not present

## 2024-12-08 DIAGNOSIS — K811 Chronic cholecystitis: Secondary | ICD-10-CM | POA: Diagnosis not present

## 2024-12-08 DIAGNOSIS — K222 Esophageal obstruction: Secondary | ICD-10-CM | POA: Diagnosis not present

## 2024-12-08 DIAGNOSIS — Z955 Presence of coronary angioplasty implant and graft: Secondary | ICD-10-CM | POA: Diagnosis not present

## 2024-12-08 DIAGNOSIS — Z7982 Long term (current) use of aspirin: Secondary | ICD-10-CM | POA: Insufficient documentation

## 2024-12-08 DIAGNOSIS — K660 Peritoneal adhesions (postprocedural) (postinfection): Secondary | ICD-10-CM

## 2024-12-08 DIAGNOSIS — K828 Other specified diseases of gallbladder: Secondary | ICD-10-CM | POA: Diagnosis present

## 2024-12-08 DIAGNOSIS — I252 Old myocardial infarction: Secondary | ICD-10-CM | POA: Insufficient documentation

## 2024-12-08 DIAGNOSIS — I10 Essential (primary) hypertension: Secondary | ICD-10-CM | POA: Diagnosis not present

## 2024-12-08 HISTORY — PX: ROBOTIC ASSISTED LAPAROSCOPIC LYSIS OF ADHESION: SHX6080

## 2024-12-08 SURGERY — CHOLECYSTECTOMY, ROBOT-ASSISTED, LAPAROSCOPIC
Anesthesia: General | Site: Abdomen

## 2024-12-08 MED ORDER — OXYCODONE HCL 5 MG PO TABS: 5.0000 mg | ORAL_TABLET | Freq: Four times a day (QID) | ORAL | 0 refills | Status: DC | PRN

## 2024-12-08 MED ORDER — ROCURONIUM BROMIDE 10 MG/ML (PF) SYRINGE
PREFILLED_SYRINGE | INTRAVENOUS | Status: DC | PRN
  Administered 2024-12-08: 08:00:00 40 mg via INTRAVENOUS

## 2024-12-08 MED ORDER — ORAL CARE MOUTH RINSE: 15.0000 mL | Freq: Once | OROMUCOSAL | Status: DC

## 2024-12-08 MED ORDER — ONDANSETRON HCL 4 MG PO TABS: 4.0000 mg | ORAL_TABLET | Freq: Every day | ORAL | 1 refills | Status: AC | PRN

## 2024-12-08 MED ORDER — OXYCODONE HCL 5 MG PO TABS: 5.0000 mg | ORAL_TABLET | Freq: Once | ORAL | Status: DC | PRN

## 2024-12-08 MED ORDER — SUGAMMADEX SODIUM 200 MG/2ML IV SOLN
INTRAVENOUS | Status: DC | PRN
  Administered 2024-12-08: 09:00:00 150 mg via INTRAVENOUS

## 2024-12-08 MED ORDER — INDOCYANINE GREEN 25 MG IJ SOLR: 2.5000 mg | Freq: Once | INTRAMUSCULAR | Status: AC

## 2024-12-08 MED ORDER — OXYCODONE HCL 5 MG/5ML PO SOLN: 5.0000 mg | Freq: Once | ORAL | Status: DC | PRN

## 2024-12-08 MED ORDER — DEXAMETHASONE SOD PHOSPHATE PF 10 MG/ML IJ SOLN
INTRAMUSCULAR | Status: DC | PRN
  Administered 2024-12-08: 08:00:00 8 mg via INTRAVENOUS

## 2024-12-08 MED ORDER — CHLORHEXIDINE GLUCONATE CLOTH 2 % EX PADS: 6.0000 | MEDICATED_PAD | Freq: Once | CUTANEOUS | Status: DC

## 2024-12-08 MED ORDER — EPHEDRINE SULFATE (PRESSORS) 25 MG/5ML IV SOSY
PREFILLED_SYRINGE | INTRAVENOUS | Status: DC | PRN
  Administered 2024-12-08: 08:00:00 10 mg via INTRAVENOUS

## 2024-12-08 MED ORDER — CHLORHEXIDINE GLUCONATE 0.12 % MT SOLN
OROMUCOSAL | Status: AC
  Administered 2024-12-08: 07:00:00 15 mL
  Filled 2024-12-08: qty 15

## 2024-12-08 MED ORDER — LACTATED RINGERS IV SOLN: INTRAVENOUS | Status: DC

## 2024-12-08 MED ORDER — SUGAMMADEX SODIUM 200 MG/2ML IV SOLN
INTRAVENOUS | Status: AC
  Filled 2024-12-08: qty 2

## 2024-12-08 MED ORDER — ONDANSETRON HCL 4 MG/2ML IJ SOLN: 4.0000 mg | Freq: Once | INTRAMUSCULAR | Status: DC | PRN

## 2024-12-08 MED ORDER — PHENYLEPHRINE 80 MCG/ML (10ML) SYRINGE FOR IV PUSH (FOR BLOOD PRESSURE SUPPORT)
PREFILLED_SYRINGE | INTRAVENOUS | Status: AC
  Filled 2024-12-08: qty 10

## 2024-12-08 MED ORDER — CHLORHEXIDINE GLUCONATE 0.12 % MT SOLN: 15.0000 mL | Freq: Once | OROMUCOSAL | Status: DC

## 2024-12-08 MED ORDER — SODIUM CHLORIDE 0.9 % IV SOLN
2.0000 g | INTRAVENOUS | Status: AC
  Administered 2024-12-08: 08:00:00 2 g via INTRAVENOUS

## 2024-12-08 MED ORDER — EPHEDRINE 5 MG/ML INJ
INTRAVENOUS | Status: AC
  Filled 2024-12-08: qty 5

## 2024-12-08 MED ORDER — BUPIVACAINE HCL (PF) 0.5 % IJ SOLN
INTRAMUSCULAR | Status: DC | PRN
  Administered 2024-12-08: 09:00:00 30 mL

## 2024-12-08 MED ORDER — ACETAMINOPHEN 500 MG PO TABS: 1000.0000 mg | ORAL_TABLET | Freq: Four times a day (QID) | ORAL | 0 refills | Status: AC

## 2024-12-08 MED ORDER — FENTANYL CITRATE (PF) 100 MCG/2ML IJ SOLN
INTRAMUSCULAR | Status: DC | PRN
  Administered 2024-12-08: 09:00:00 25 ug via INTRAVENOUS
  Administered 2024-12-08: 08:00:00 75 ug via INTRAVENOUS

## 2024-12-08 MED ORDER — BUPIVACAINE HCL (PF) 0.5 % IJ SOLN
INTRAMUSCULAR | Status: AC
  Filled 2024-12-08: qty 30

## 2024-12-08 MED ORDER — STERILE WATER FOR IRRIGATION IR SOLN
Status: DC | PRN
  Administered 2024-12-08: 08:00:00 500 mL

## 2024-12-08 MED ORDER — PROPOFOL 10 MG/ML IV BOLUS
INTRAVENOUS | Status: DC | PRN
  Administered 2024-12-08: 08:00:00 150 mg via INTRAVENOUS

## 2024-12-08 MED ORDER — FENTANYL CITRATE (PF) 100 MCG/2ML IJ SOLN
INTRAMUSCULAR | Status: AC
  Filled 2024-12-08: qty 2

## 2024-12-08 MED ORDER — SODIUM CHLORIDE 0.9 % IV SOLN
INTRAVENOUS | Status: AC
  Filled 2024-12-08: qty 2

## 2024-12-08 MED ORDER — ONDANSETRON HCL 4 MG/2ML IJ SOLN
INTRAMUSCULAR | Status: DC | PRN
  Administered 2024-12-08: 09:00:00 4 mg via INTRAVENOUS

## 2024-12-08 MED ORDER — PROPOFOL 500 MG/50ML IV EMUL
INTRAVENOUS | Status: AC
  Filled 2024-12-08: qty 50

## 2024-12-08 MED ORDER — INDOCYANINE GREEN 25 MG IJ SOLR
INTRAMUSCULAR | Status: AC
  Administered 2024-12-08: 07:00:00 2.5 mg via INTRAVENOUS
  Filled 2024-12-08: qty 10

## 2024-12-08 MED ORDER — LIDOCAINE 2% (20 MG/ML) 5 ML SYRINGE
INTRAMUSCULAR | Status: DC | PRN
  Administered 2024-12-08: 08:00:00 75 mg via INTRAVENOUS

## 2024-12-08 MED ORDER — LACTATED RINGERS IV SOLN: INTRAVENOUS | Status: DC | PRN

## 2024-12-08 MED ORDER — FENTANYL CITRATE (PF) 50 MCG/ML IJ SOSY
25.0000 ug | PREFILLED_SYRINGE | INTRAMUSCULAR | Status: DC | PRN
  Administered 2024-12-08 (×2): 50 ug via INTRAVENOUS
  Filled 2024-12-08: qty 1

## 2024-12-08 MED FILL — Fentanyl Citrate PF Soln Prefilled Syringe 50 MCG/ML: INTRAMUSCULAR | Qty: 1 | Status: AC

## 2024-12-08 SURGICAL SUPPLY — 36 items
BLADE SURG 15 STRL LF DISP TIS (BLADE) ×1 IMPLANT
CAUTERY HOOK MNPLR 1.6 DVNC XI (INSTRUMENTS) ×1 IMPLANT
CHLORAPREP W/TINT 26 (MISCELLANEOUS) ×1 IMPLANT
CLIP LIGATING HEM O LOK PURPLE (MISCELLANEOUS) ×1 IMPLANT
COVER LIGHT HANDLE (MISCELLANEOUS) IMPLANT
DEFOGGER SCOPE WARM SEASHARP (MISCELLANEOUS) ×1 IMPLANT
DERMABOND ADVANCED .7 DNX12 (GAUZE/BANDAGES/DRESSINGS) ×1 IMPLANT
DRAPE ARM DVNC X/XI (DISPOSABLE) ×4 IMPLANT
DRAPE COLUMN DVNC XI (DISPOSABLE) ×1 IMPLANT
ELECTRODE REM PT RTRN 9FT ADLT (ELECTROSURGICAL) ×1 IMPLANT
FORCEPS BPLR R/ABLATION 8 DVNC (INSTRUMENTS) ×1 IMPLANT
FORCEPS PROGRASP DVNC XI (FORCEP) ×1 IMPLANT
GLOVE BIOGEL PI IND STRL 6.5 (GLOVE) ×2 IMPLANT
GLOVE BIOGEL PI IND STRL 7.0 (GLOVE) ×3 IMPLANT
GLOVE SURG SS PI 6.5 STRL IVOR (GLOVE) ×2 IMPLANT
GOWN STRL REUS W/TWL LRG LVL3 (GOWN DISPOSABLE) ×3 IMPLANT
GRASPER SUT TROCAR 14GX15 (MISCELLANEOUS) ×1 IMPLANT
KIT TURNOVER KIT A (KITS) ×1 IMPLANT
MANIFOLD NEPTUNE II (INSTRUMENTS) ×1 IMPLANT
NDL HYPO 21X1.5 SAFETY (NEEDLE) ×1 IMPLANT
NDL INSUFFLATION 14GA 120MM (NEEDLE) ×1 IMPLANT
NEEDLE HYPO 21X1.5 SAFETY (NEEDLE) ×1 IMPLANT
NEEDLE INSUFFLATION 14GA 120MM (NEEDLE) ×1 IMPLANT
OBTURATOR OPTICALSTD 8 DVNC (TROCAR) ×1 IMPLANT
PACK LAP CHOLE LZT030E (CUSTOM PROCEDURE TRAY) ×1 IMPLANT
PAD ARMBOARD POSITIONER FOAM (MISCELLANEOUS) ×1 IMPLANT
PENCIL HANDSWITCHING (ELECTRODE) ×1 IMPLANT
POSITIONER HEAD 8X9X4 ADT (SOFTGOODS) ×1 IMPLANT
SEAL UNIV 5-12 XI (MISCELLANEOUS) ×4 IMPLANT
SET BASIN LINEN APH (SET/KITS/TRAYS/PACK) ×1 IMPLANT
SET TUBE SMOKE EVAC HIGH FLOW (TUBING) ×1 IMPLANT
SUT MNCRL AB 4-0 PS2 18 (SUTURE) ×2 IMPLANT
SUT VICRYL 0 UR6 27IN ABS (SUTURE) ×1 IMPLANT
SYR 30ML LL (SYRINGE) ×1 IMPLANT
SYSTEM RETRIEVL 5MM INZII UNIV (BASKET) ×1 IMPLANT
WATER STERILE IRR 500ML POUR (IV SOLUTION) ×1 IMPLANT

## 2024-12-08 NOTE — Op Note (Signed)
 [facility] Operative Note  12/08/2024  Preoperative Diagnosis: Biliary dyskinesia   Postoperative Diagnosis: Same   Procedure(s) Performed: Robotic Assisted Laparoscopic Cholecystectomy, laparoscopic lysis of adhesions   Surgeon: [name], DO   Assistants: No qualified resident was available    Anesthesia: General endotracheal   Anesthesiologist: Dr. Kendell    Specimens: Gallbladder   Estimated Blood Loss: Minimal   Blood Replacement: None    Complications: None   Wound Class: Clean contaminated   Operative Indications: The patient was found to have biliary dyskinesia on imaging and was symptomatic.  We discussed the risk of the procedure including but not limited to bleeding, infection, injury to the common bile duct, bile leak, need for further procedures, chance of subtotal cholecystectomy.   Findings:  Non-inflamed gallbladder with some adhesions indicating chronic inflammation Omental adhesions to the anterior abdominal wall Critical view of safety noted All clips intact at the end of the case Adequate hemostasis   Procedure: Firefly was given in the preoperative area. The patient was taken to the operating room and placed supine. General endotracheal anesthesia was induced. Intravenous antibiotics were administered per protocol.  An orogastric tube positioned to decompress the stomach. The abdomen was prepared and draped in the usual sterile fashion. A time-out was completed verifying correct patient, procedure, site, positioning, and implant(s) and/or special equipment prior to beginning this procedure.  Veress needle was placed at the infraumbilical area and insufflation was started after confirming a positive saline drop test and no immediate increase in abdominal pressure.  After reaching 15 mm, the Veress needle was removed and a 8 mm port was placed via optiview technique infraumbilical, measuring 20 mm away from the suspected position  of the gallbladder.  The abdomen was inspected and no abnormalities or injuries were found.  Under direct vision, ports were placed in the following locations in a semi curvilinear position around the target of the gallbladder: Two 8 mm ports on the patient's right each having 8cm clearance to the adjacent ports and one 8 mm port placed on the patient's left 8 cm from the umbilical port. Once ports were placed, the table was placed in the reverse Trendelenburg position with the right side up. The Xi platform was brought into the operative field and docked to the ports successfully.  An endoscope was placed through the umbilical port, prograsp through the most lateral right port, fenestrated bipolar to the port just right of the umbilicus, and then a hook cautery in the left port.  The omentum was adhesed to the anterior abdominal wall, limiting view of the gallbladder.  These adhesions were taken down with electrocautery.  The dome of the gallbladder was grasped with prograsp and retracted over the dome of the liver. Adhesions between the gallbladder and omentum, duodenum and transverse colon were lysed via hook cautery. The infundibulum was grasped with the fenestrated grasper and retracted toward the right lower quadrant. This maneuver exposed Calots triangle. Firefly was used throughout the dissection to ensure safe visualization of the cystic duct.  The peritoneum overlying the gallbladder infundibulum was then dissected and the cystic duct and cystic artery identified.  Critical view of safety with the liver bed clearly visible behind the duct and artery with no additional structures noted.  The cystic duct and cystic artery were doubly clipped and divided close to the gallbladder.    The gallbladder was then dissected from its peritoneal and liver bed attachments by electrocautery. Hemostasis was checked prior to removing the hook  cautery.  The Anton was undocked and moved out of the field.  A 5mm Endo Catch  bag was then placed through the umbilical port and the gallbladder was removed.  The gallbladder was passed off the table as a specimen. There was no evidence of bleeding from the gallbladder fossa or cystic artery or leakage of the bile from the cystic duct stump. The umbilical port site closed with a 0 vicryl with a PMI needle.  The abdomen was desufflated and secondary trocars were removed under direct vision. No bleeding was noted. Incisions were localized with marcaine .  All skin incisions were closed with subcuticular sutures of 4-0 monocryl and dermabond.   Final inspection revealed acceptable hemostasis. All counts were correct at the end of the case. The patient was awakened from anesthesia and extubated without complication. The OG tube was removed.  The patient went to the PACU in stable condition.   [name], DO [facility] [address] [place], [place] [address] [phone] (office)

## 2024-12-08 NOTE — Anesthesia Procedure Notes (Signed)
 Procedure Name: Intubation Date/Time: 12/08/2024 7:43 AM  Performed by: Toribio Darice [place], CRNAPre-anesthesia Checklist: Patient identified, Patient being monitored, Timeout performed, Emergency Drugs available and Suction available Patient Re-evaluated:Patient Re-evaluated prior to induction Oxygen Delivery Method: Circle system utilized Preoxygenation: Pre-oxygenation with 100% oxygen Induction Type: IV induction Ventilation: Mask ventilation without difficulty Laryngoscope Size: Mac and 3 Grade View: Grade I Tube type: Oral Tube size: 7.0 mm Number of attempts: 1 Airway Equipment and Method: Stylet Placement Confirmation: ETT inserted through vocal cords under direct vision, positive ETCO2 and breath sounds checked- equal and bilateral Secured at: 21 cm Tube secured with: Tape Dental Injury: Teeth and Oropharynx as per pre-operative assessment

## 2024-12-08 NOTE — H&P (Signed)
 [facility] History and Physical   Reason for Referral: Biliary dyskinesia/abdominal pain Referring Physician: Dr. Cinderella   Chief Complaint   New Patient (Initial Visit)        Kristy Hall is a 68 y.o. female.  HPI: Patient presents for evaluation of possible biliary dyskinesia.  For the last 3 months, she has been having issues with constant epigastric abdominal pain and solid food dysphagia/regurgitation.  Her dysphagia issues have been going on for multiple years, but the epigastric abdominal pain is new.  She underwent an EGD which demonstrated the Schatzki ring.  She subsequently underwent a HIDA which demonstrated hyperkinesia of the gallbladder.  Her epigastric pain will radiate into her back.  Recently she has noted bloating and nausea.  She denies any episodes of emesis.  Nothing seems to make her pain better and she has never had pain like this in the past.  Of note, her pain was worse after consuming beer recently.  Her past medical history significant for coronary artery disease status post PCI and stent placement in 2020, hypertension, hyperlipidemia, and arthritis.  She takes an 81 mg aspirin  daily.  Her surgical history is significant for a vaginal hysterectomy and hemorrhoidectomy.  She smokes a pack of cigarettes per day and rarely consumes alcohol.  She denies use of illicit drugs.       Past Medical History:  Diagnosis Date   Hypertension     Mixed hyperlipidemia 11/05/2019   SCCA (squamous cell carcinoma) of skin 10/18/2020    Right Lower Leg-Anterior (well diff)    SCCA (squamous cell carcinoma) of skin 05/29/2022    Mid Supratip of Nose (in situ) (tx p bx)   Tobacco use disorder 11/05/2019               Past Surgical History:  Procedure Laterality Date   ABDOMINAL HYSTERECTOMY       BLADDER SUSPENSION       CARPAL TUNNEL RELEASE Bilateral     COLONOSCOPY       CORONARY STENT INTERVENTION N/A 11/05/2019    Procedure: CORONARY STENT  INTERVENTION;  Surgeon: Ladona Heinz, MD;  Location: [facility];  Service: Cardiovascular;  Laterality: N/A;   ESOPHAGEAL DILATION N/A 09/27/2024    Procedure: DILATION, ESOPHAGUS;  Surgeon: Cinderella Deatrice FALCON, MD;  Location: [facility]O [facility];  Service: Endoscopy;  Laterality: N/A;   ESOPHAGOGASTRODUODENOSCOPY N/A 09/27/2024    Procedure: EGD (ESOPHAGOGASTRODUODENOSCOPY);  Surgeon: Cinderella Deatrice FALCON, MD;  Location: [facility];  Service: Endoscopy;  Laterality: N/A;  9:00am, ASA 1-2   HEMORROIDECTOMY       LEFT HEART CATH AND CORONARY ANGIOGRAPHY N/A 11/05/2019    Procedure: LEFT HEART CATH AND CORONARY ANGIOGRAPHY;  Surgeon: Ladona Heinz, MD;  Location: [facility] INVAS[facility] CV LAB;  Service: Cardiovascular;  Laterality: N/A;               Family History  Problem Relation Age of Onset   COPD Mother     Basal cell carcinoma Mother     COPD Brother     Breast cancer Neg Hx            Social History  Social History         Tobacco Use   Smoking status: Former      Current packs/day: 0.00      Average packs/day: 0.5 packs/day for 15.0 years (7.5 ttl pk-yrs)      Types: Cigarettes      Start  date: 04/2005      Quit date: 04/2020      Years since quitting: 4.5   Smokeless tobacco: Never  Vaping Use   Vaping status: Former  Substance Use Topics   Alcohol use: Yes      Comment: occasional   Drug use: No        Medications: I have reviewed the patient's current medications. Allergies as of 10/26/2024   No Known Allergies         Medication List           Accurate as of October 26, 2024  9:52 AM. If you have any questions, ask your nurse or doctor.              acetaminophen  325 MG tablet Commonly known as: TYLENOL  Take 2 tablets (650 mg total) by mouth every 4 (four) hours as needed for headache or mild pain.    aspirin  81 MG chewable tablet Commonly known as: Aspirin  Childrens Chew 1 tablet (81 mg total) by mouth daily.    atorvastatin  80 MG tablet Commonly known  as: LIPITOR TAKE 1 TABLET BY MOUTH ONCE  DAILY AT 6 PM    CALCIUM -VITAMIN D PO Take by mouth.    LORazepam 0.5 MG tablet Commonly known as: ATIVAN Take 0.5 mg by mouth as needed.    losartan  100 MG tablet Commonly known as: COZAAR  TAKE 1 TABLET BY MOUTH DAILY  AFTER SUPPER    metoprolol  succinate 50 MG 24 hr tablet Commonly known as: TOPROL -XL Take 1 tablet (50 mg total) by mouth daily. What changed: how much to take    pantoprazole 40 MG tablet Commonly known as: PROTONIX Take by mouth.    VITAMIN C PO Take by mouth.               ROS:  Constitutional: negative for chills, fatigue, and fevers Eyes: negative for visual disturbance and pain Ears, nose, mouth, throat, and face: negative for ear drainage, sore throat, and sinus problems Respiratory: negative for cough, wheezing, and shortness of breath Cardiovascular: negative for chest pain and palpitations Gastrointestinal: positive for abdominal pain, nausea, and reflux symptoms, negative for vomiting Genitourinary:negative for dysuria and frequency Integument/breast: negative for dryness and rash Hematologic/lymphatic: negative for bleeding and lymphadenopathy Musculoskeletal:negative for back pain and neck pain Neurological: negative for dizziness and tremors Endocrine: negative for temperature intolerance   Blood pressure (!) 155/87, pulse 66, temperature 97.7 F (36.5 C), temperature source Oral, resp. rate 16, height 5' (1.524 m), weight 138 lb (62.6 kg), SpO2 95%. Physical Exam Vitals reviewed.  Constitutional:      Appearance: Normal appearance.  HENT:     Head: Normocephalic and atraumatic.  Eyes:     Extraocular Movements: Extraocular movements intact.     Pupils: Pupils are equal, round, and reactive to light.  Cardiovascular:     Rate and Rhythm: Normal rate and regular rhythm.  Pulmonary:     Effort: Pulmonary effort is normal.     Breath sounds: Normal breath sounds.  Abdominal:     Comments:  Abdomen soft, nondistended, no percussion tenderness, mild right upper quadrant tenderness to palpation; no rigidity, guarding, rebound tenderness; negative Murphy sign  Musculoskeletal:        General: Normal range of motion.     Cervical back: Normal range of motion.  Skin:    General: Skin is warm and dry.  Neurological:     General: No focal deficit present.     Mental  Status: She is alert and oriented to person, place, and time.  Psychiatric:        Mood and Affect: Mood normal.        Behavior: Behavior normal.       Results: HIDA with EF (10/04/2024): IMPRESSION: 1.  Patent cystic and common bile ducts.   2. Elevated gallbladder ejection fraction as can be seen with gallbladder hyperkinesia.   Assessment & Plan:  Kristy Hall is a 68 y.o. female who presents for evaluation of biliary hyperkinesia.   -We discussed the pathophysiology of gallbladder disease and the recommendations for cholecystectomy.  We discussed that biliary dyskinesia can result in multiple vague symptoms, and her gallbladder is likely contributing to some of her epigastric abdominal pain. -I counseled the patient about the indication, risks and benefits of robotic assisted laparoscopic cholecystectomy.  She understands there is a very small chance for bleeding, infection, injury to normal structures (including common bile duct), conversion to open surgery, persistent symptoms, evolution of postcholecystectomy diarrhea, need for secondary interventions, anesthesia reaction, cardiopulmonary issues and other risks not specifically detailed here. I described the expected recovery, the plan for follow-up and the restrictions during the recovery phase.  All questions were answered. -Patient has been cleared by cardiology -Plan for robotic assisted laparoscopic cholecystectomy 12/17 -Information provided to the patient regarding biliary dyskinesia, cholecystitis, cholecystectomy, and low-fat diet -Advised if she  begins to have severe worsening right upper quadrant abdominal pain that does not improve, nausea, vomiting, fever, and chills, she should present to the emergency department for evaluation   All questions were answered to the satisfaction of the patient.   Note: Portions of this report may have been transcribed using voice recognition software. Every effort has been made to ensure accuracy; however, inadvertent computerized transcription errors may still be present.    [name], DO [facility] [address] [place], [place] [address] [phone] (office)

## 2024-12-08 NOTE — Anesthesia Preprocedure Evaluation (Signed)
 Anesthesia Evaluation  Patient identified by MRN, date of birth, ID band Patient awake    Reviewed: Allergy & Precautions, H&P , NPO status , Patient's Chart, lab work & pertinent test results, reviewed documented beta blocker date and time   Airway Mallampati: II  TM Distance: >3 FB Neck ROM: full    Dental no notable dental hx.    Pulmonary neg pulmonary ROS, Current Smoker   Pulmonary exam normal breath sounds clear to auscultation       Cardiovascular Exercise Tolerance: Good hypertension, + angina  + CAD, + Past MI and + Cardiac Stents   Rhythm:regular Rate:Normal     Neuro/Psych negative neurological ROS  negative psych ROS   GI/Hepatic negative GI ROS, Neg liver ROS,,,  Endo/Other  negative endocrine ROS    Renal/GU negative Renal ROS  negative genitourinary   Musculoskeletal   Abdominal   Peds  Hematology negative hematology ROS (+)   Anesthesia Other Findings   Reproductive/Obstetrics negative OB ROS                              Anesthesia Physical Anesthesia Plan  ASA: 3  Anesthesia Plan: General ETT and General   Post-op Pain Management:    Induction:   PONV Risk Score and Plan: Ondansetron  and Scopolamine patch - Pre-op  Airway Management Planned:   Additional Equipment:   Intra-op Plan:   Post-operative Plan:   Informed Consent: I have reviewed the patients History and Physical, chart, labs and discussed the procedure including the risks, benefits and alternatives for the proposed anesthesia with the patient or authorized representative who has indicated his/her understanding and acceptance.     Dental Advisory Given  Plan Discussed with: CRNA  Anesthesia Plan Comments:         Anesthesia Quick Evaluation

## 2024-12-08 NOTE — Discharge Instructions (Signed)
 Ambulatory Surgery Discharge Instructions  General Anesthesia or Sedation Do not drive or operate heavy machinery for 24 hours.  Do not consume alcohol, tranquilizers, sleeping medications, or any non-prescribed medications for 24 hours. Do not make important decisions or sign any important papers in the next 24 hours. You should have someone with you tonight at home.  Activity  You are advised to go directly home from the hospital.  Restrict your activities and rest for a day.  Resume light activity tomorrow. No heavy lifting over 10 lbs or strenuous exercise.  Fluids and Diet Begin with clear liquids, bouillon, dry toast, soda crackers.  If not nauseated, you may go to a regular diet when you desire.  Greasy and spicy foods are not advised.  Medications  If you have not had a bowel movement in 24 hours, take 2 tablespoons over the counter Milk of mag.             You May resume your blood thinners tomorrow (Aspirin, coumadin, or other).  You are being discharged with prescriptions for Opioid/Narcotic Medications: There are some specific considerations for these medications that you should know. Opioid Meds have risks & benefits. Addiction to these meds is always a concern with prolonged use Take medication only as directed Do not drive while taking narcotic pain medication Do not crush tablets or capsules Do not use a different container than medication was dispensed in Lock the container of medication in a cool, dry place out of reach of children and pets. Opioid medication can cause addiction Do not share with anyone else (this is a felony) Do not store medications for future use. Dispose of them properly.     Disposal:  Find a [employer]  household drug take back site near you.  If you can't get to a drug take back site, use the recipe below as a last resort to dispose of expired, unused or unwanted drugs. Disposal  (Do not dispose chemotherapy drugs this way, talk to your  prescribing doctor instead.) Step 1: Mix drugs (do not crush) with dirt, kitty litter, or used coffee grounds and add a small amount of water  to dissolve any solid medications. Step 2: Seal drugs in plastic bag. Step 3: Place plastic bag in trash. Step 4: Take prescription container and scratch out personal information, then recycle or throw away.  Operative Site  You have a liquid bandage over your incisions, this will begin to flake off in about a week. Ok to English as a second language teacher. Keep wound clean and dry. No baths or swimming. No lifting more than 10 pounds.  Contact Information: If you have questions or concerns, please call our office, [phone], Monday- Thursday 8AM-5PM and Friday 8AM-12Noon.  If it is after hours or on the weekend, please call [facility] Main Number, [phone], and ask to speak to the surgeon on call for Dr. Evonnie at [facility].   SPECIFIC COMPLICATIONS TO WATCH FOR: Inability to urinate Fever over 101? F by mouth Nausea and vomiting lasting longer than 24 hours. Pain not relieved by medication ordered Swelling around the operative site Increased redness, warmth, hardness, around operative area Numbness, tingling, or cold fingers or toes Blood -soaked dressing, (small amounts of oozing may be normal) Increasing and progressive drainage from surgical area or exam site

## 2024-12-08 NOTE — Transfer of Care (Signed)
 Immediate Anesthesia Transfer of Care Note  Patient: Kristy Hall Stage  Procedure(s) Performed: CHOLECYSTECTOMY, ROBOT-ASSISTED, LAPAROSCOPIC (Abdomen) LYSIS, ADHESIONS, ROBOT-ASSISTED, LAPAROSCOPIC (Abdomen)  Patient Location: PACU  Anesthesia Type:General  Level of Consciousness: awake  Airway & Oxygen Therapy: Patient Spontanous Breathing  Post-op Assessment: Report given to RN  Post vital signs: Reviewed and stable  Last Vitals:  Vitals Value Taken Time  BP    Temp    Pulse 70 12/08/24 08:56  Resp 18 12/08/24 08:56  SpO2 95 % 12/08/24 08:56  Vitals shown include unfiled device data.  Last Pain:  Vitals:   12/08/24 0659  PainSc: 0-No pain         Complications: No notable events documented.

## 2024-12-08 NOTE — Progress Notes (Signed)
[facility]  Spoke with the patient's daughter on the phone.  I explained that she tolerated the procedure without difficulty.  She has dissolvable stitches under the skin with overlying skin glue.  This will flake off in 10 to 14 days.  I discharged her home with a prescription for narcotic pain medication that they should take as needed for pain.  I also want her taking scheduled Tylenol.  If they take the narcotic pain medication, they should take a stool softener as well.  The patient will follow-up with me in 2 weeks for phone follow-up.  All questions were answered to her expressed satisfaction.  Theophilus Kinds, DO [facility] [address] [place], [place] [address] [phone] (office)

## 2024-12-09 ENCOUNTER — Encounter (HOSPITAL_COMMUNITY): Payer: Self-pay | Admitting: Surgery

## 2024-12-09 LAB — SURGICAL PATHOLOGY

## 2024-12-10 NOTE — Anesthesia Postprocedure Evaluation (Signed)
"   Anesthesia Post Note  Patient: Kristy Hall  Procedure(s) Performed: CHOLECYSTECTOMY, ROBOT-ASSISTED, LAPAROSCOPIC (Abdomen) LYSIS, ADHESIONS, ROBOT-ASSISTED, LAPAROSCOPIC (Abdomen)  Patient location during evaluation: Phase II Anesthesia Type: General Level of consciousness: awake Pain management: pain level controlled Vital Signs Assessment: post-procedure vital signs reviewed and stable Respiratory status: spontaneous breathing and respiratory function stable Cardiovascular status: blood pressure returned to baseline and stable Postop Assessment: no headache and no apparent nausea or vomiting Anesthetic complications: no Comments: Late entry   No notable events documented.   Last Vitals:  Vitals:   12/08/24 0954 12/08/24 1001  BP: 126/71 130/72  Pulse: 72 76  Resp: 13 14  Temp:  36.5 C  SpO2: 95% 92%    Last Pain:  Vitals:   12/08/24 0954  PainSc: 2                  Yvonna PARAS Jayde Mcallister      "

## 2024-12-11 ENCOUNTER — Encounter (HOSPITAL_COMMUNITY): Payer: Self-pay

## 2024-12-11 ENCOUNTER — Emergency Department (HOSPITAL_COMMUNITY)
Admission: EM | Admit: 2024-12-11 | Discharge: 2024-12-11 | Disposition: A | Discharge status: Home / Self Care | Attending: Emergency Medicine | Admitting: Emergency Medicine

## 2024-12-11 ENCOUNTER — Emergency Department (HOSPITAL_COMMUNITY)

## 2024-12-11 ENCOUNTER — Other Ambulatory Visit: Payer: Self-pay

## 2024-12-11 DIAGNOSIS — E279 Disorder of adrenal gland, unspecified: Secondary | ICD-10-CM

## 2024-12-11 DIAGNOSIS — I10 Essential (primary) hypertension: Secondary | ICD-10-CM | POA: Diagnosis not present

## 2024-12-11 DIAGNOSIS — R1011 Right upper quadrant pain: Secondary | ICD-10-CM | POA: Diagnosis present

## 2024-12-11 DIAGNOSIS — Z79899 Other long term (current) drug therapy: Secondary | ICD-10-CM | POA: Diagnosis not present

## 2024-12-11 DIAGNOSIS — K5900 Constipation, unspecified: Secondary | ICD-10-CM | POA: Insufficient documentation

## 2024-12-11 DIAGNOSIS — Z7982 Long term (current) use of aspirin: Secondary | ICD-10-CM | POA: Diagnosis not present

## 2024-12-11 LAB — CBC WITH DIFFERENTIAL/PLATELET
Basophils Absolute: 0 K/uL (ref 0.0–0.1)
Basophils Relative: 0 %
Eosinophils Absolute: 0.1 K/uL (ref 0.0–0.5)
Eosinophils Relative: 1 %
HCT: 41.4 % (ref 36.0–46.0)
Hemoglobin: 14 g/dL (ref 12.0–15.0)
Lymphocytes Relative: 11 %
Lymphs Abs: 1.5 K/uL (ref 0.7–4.0)
MCH: 30.1 pg (ref 26.0–34.0)
MCHC: 33.8 g/dL (ref 30.0–36.0)
MCV: 89 fL (ref 80.0–100.0)
Monocytes Absolute: 1.5 K/uL — ABNORMAL HIGH (ref 0.1–1.0)
Monocytes Relative: 11 %
Neutro Abs: 10.5 K/uL — ABNORMAL HIGH (ref 1.7–7.7)
Neutrophils Relative %: 77 %
Platelets: 199 K/uL (ref 150–400)
RBC: 4.65 MIL/uL (ref 3.87–5.11)
RDW: 12.7 % (ref 11.5–15.5)
Smear Review: NORMAL
WBC: 13.7 K/uL — ABNORMAL HIGH (ref 4.0–10.5)
nRBC: 0 % (ref 0.0–0.2)

## 2024-12-11 LAB — COMPREHENSIVE METABOLIC PANEL WITH GFR
ALT: 15 U/L (ref 0–44)
AST: 17 U/L (ref 15–41)
Albumin: 4.2 g/dL (ref 3.5–5.0)
Alkaline Phosphatase: 106 U/L (ref 38–126)
Anion gap: 14 (ref 5–15)
BUN: 9 mg/dL (ref 8–23)
CO2: 23 mmol/L (ref 22–32)
Calcium: 9.7 mg/dL (ref 8.9–10.3)
Chloride: 101 mmol/L (ref 98–111)
Creatinine, Ser: 0.5 mg/dL (ref 0.44–1.00)
GFR, Estimated: >60 mL/min
Glucose, Bld: 103 mg/dL — ABNORMAL HIGH (ref 70–99)
Potassium: 3.4 mmol/L — ABNORMAL LOW (ref 3.5–5.1)
Sodium: 138 mmol/L (ref 135–145)
Total Bilirubin: 1.5 mg/dL — ABNORMAL HIGH (ref 0.0–1.2)
Total Protein: 6.7 g/dL (ref 6.5–8.1)

## 2024-12-11 MED ORDER — LACTULOSE 10 GM/15ML PO SOLN
10.0000 g | Freq: Once | ORAL | Status: AC
  Administered 2024-12-11: 10 g via ORAL
  Filled 2024-12-11: qty 30

## 2024-12-11 MED ORDER — POTASSIUM CHLORIDE CRYS ER 20 MEQ PO TBCR
40.0000 meq | EXTENDED_RELEASE_TABLET | Freq: Once | ORAL | Status: AC
  Administered 2024-12-11: 40 meq via ORAL
  Filled 2024-12-11: qty 2

## 2024-12-11 MED ORDER — LACTULOSE 10 GM/15ML PO SOLN: 10.0000 g | Freq: Every day | ORAL | 0 refills | Status: AC | PRN

## 2024-12-11 MED ORDER — FENTANYL CITRATE (PF) 100 MCG/2ML IJ SOLN
50.0000 ug | Freq: Once | INTRAMUSCULAR | Status: AC
  Administered 2024-12-11: 50 ug via INTRAVENOUS
  Filled 2024-12-11: qty 2

## 2024-12-11 MED ORDER — ONDANSETRON HCL 4 MG/2ML IJ SOLN
4.0000 mg | Freq: Once | INTRAMUSCULAR | Status: AC
  Administered 2024-12-11: 4 mg via INTRAVENOUS
  Filled 2024-12-11: qty 2

## 2024-12-11 MED ORDER — IOHEXOL 300 MG/ML  SOLN
100.0000 mL | Freq: Once | INTRAMUSCULAR | Status: AC | PRN
  Administered 2024-12-11: 100 mL via INTRAVENOUS

## 2024-12-11 NOTE — ED Triage Notes (Signed)
 Pt had lap chole done on 12/17 and is still having RUQ pain and constipation.  Pt reports she is only taking tylenol  because they told her the pain medicine they gave her would cause more constipation and she has been constipated since before the surgery, no BM x 6 days.

## 2024-12-11 NOTE — ED Provider Notes (Signed)
 " [place] EMERGENCY DEPARTMENT AT [facility] Provider Note   CSN: [id number] Arrival date & time: 12/11/24  1133     Patient presents with: Post-op Problem   EARMA NICOLAOU is a 68 y.o. female.  To ER for right upper quadrant abdominal pain.  She reports that she had a cholecystectomy on 12/17.  She had been constipated for 3 days prior to this, she had started taking stool softeners prior to the surgery, since the surgery she has been taking Senokot daily and started taking MiraLAX yesterday with no relief.  She admits to nausea but no vomiting, no fevers or chills.  Pain is constant in the right upper quadrant.  She denies any vomiting but has been nauseated.  She denies urinary symptoms.   HPI     Prior to Admission medications  Medication Sig Start Date End Date Taking? Authorizing Provider  acetaminophen  (TYLENOL ) 500 MG tablet Take 2 tablets (1,000 mg total) by mouth every 6 (six) hours for 7 days. 12/08/24 12/15/24  Pappayliou, Dorothyann A, DO  Ascorbic Acid (VITAMIN C PO) Take 1 tablet by mouth daily.    [provider]  aspirin  (ASPIRIN  CHILDRENS) 81 MG chewable tablet Chew 1 tablet (81 mg total) by mouth daily. 12/26/20   Ladona Heinz, MD  atorvastatin  (LIPITOR) 80 MG tablet TAKE 1 TABLET BY MOUTH ONCE  DAILY AT 6 PM 05/08/23   Ladona Heinz, MD  docusate sodium (COLACE) 100 MG capsule Take 1 capsule (100 mg total) by mouth 2 (two) times daily. 12/08/24 12/08/25  Pappayliou, Dorothyann A, DO  losartan  (COZAAR ) 100 MG tablet TAKE 1 TABLET BY MOUTH DAILY  AFTER SUPPER 11/12/24   Ladona Heinz, MD  losartan  (COZAAR ) 50 MG tablet Take 50 mg by mouth daily.    [provider]  metoprolol  succinate (TOPROL -XL) 25 MG 24 hr tablet Take 25 mg by mouth daily.    [provider]  metoprolol  succinate (TOPROL -XL) 50 MG 24 hr tablet Take 1 tablet (50 mg total) by mouth daily. Patient not taking: Reported on 12/01/2024 12/26/21   Ladona Heinz, MD  naproxen sodium  (ALEVE) 220 MG tablet Take 220 mg by mouth 2 (two) times daily as needed (pain).    [provider]  ondansetron  (ZOFRAN ) 4 MG tablet Take 1 tablet (4 mg total) by mouth daily as needed for nausea or vomiting. 12/08/24 12/08/25  Pappayliou, Dorothyann A, DO  oxyCODONE  (ROXICODONE ) 5 MG immediate release tablet Take 1 tablet (5 mg total) by mouth every 6 (six) hours as needed. 12/08/24   Pappayliou, Dorothyann A, DO  pantoprazole (PROTONIX) 40 MG tablet Take 40 mg by mouth daily. 07/28/24   [provider]    Allergies: Patient has no known allergies.    Review of Systems  Updated Vital Signs BP (!) 141/82 (BP Location: Left Arm)   Pulse 65   Temp 98 F (36.7 C) (Oral)   Resp 18   Wt 62 kg   SpO2 99%   BMI 26.69 kg/m   Physical Exam Vitals and nursing note reviewed.  Constitutional:      General: She is not in acute distress.    Appearance: She is well-developed.  HENT:     Head: Normocephalic and atraumatic.     Mouth/Throat:     Mouth: Mucous membranes are moist.  Eyes:     Conjunctiva/sclera: Conjunctivae normal.  Cardiovascular:     Rate and Rhythm: Normal rate and regular rhythm.  Heart sounds: No murmur heard. Pulmonary:     Effort: Pulmonary effort is normal. No respiratory distress.     Breath sounds: Normal breath sounds.  Abdominal:     Palpations: Abdomen is soft.     Tenderness: There is no abdominal tenderness.  Musculoskeletal:        General: No swelling.     Cervical back: Neck supple.  Skin:    General: Skin is warm and dry.     Capillary Refill: Capillary refill takes less than 2 seconds.  Neurological:     General: No focal deficit present.     Mental Status: She is alert and oriented to person, place, and time.  Psychiatric:        Mood and Affect: Mood normal.     (all labs ordered are listed, but only abnormal results are displayed) Labs Reviewed  CBC WITH DIFFERENTIAL/PLATELET  COMPREHENSIVE METABOLIC PANEL WITH GFR     EKG: None  Radiology: No results found.   Procedures   Medications Ordered in the ED  ondansetron  (ZOFRAN ) injection 4 mg (has no administration in time range)  fentaNYL  (SUBLIMAZE ) injection 50 mcg (has no administration in time range)                                    Medical Decision Making This patient presents to the ED for concern of dental pain and constipation, this involves an extensive number of treatment options, and is a complaint that carries with it a high risk of complications and morbidity.  The differential diagnosis includes obstruction, constipation, postop infection, hollow viscus perforation, other  Co morbidities that complicate the patient evaluation  Hypertension, high cholesterol   Additional history obtained:  Additional history obtained from EMR External records from outside source obtained and reviewed including prior notes and labs   Lab Tests:  I Ordered, and personally interpreted labs.  The pertinent results include: CBC with white blood cell count 13.7, CMP with potassium 3.4, normal kidney function, bilirubin 1.5   Imaging Studies ordered:  I ordered imaging studies including CT abdomen pelvis I independently visualized and interpreted imaging which showed postop cholecystectomy changes and large amount of stool in the right side of the colon, no acute process I agree with the radiologist interpretation     Problem List / ED Course / Critical interventions / Medication management  Right-sided abdominal pain after cholecystectomy-patient was constipated prior to surgery and still has not had bowel movement.  She is avoiding opiates to prevent worsening constipation.  CT was ordered in light of her pain though her exam is reassuring.  Shows sequela of recent cholecystectomy with no discrete fluid collections and no significant biliary ductal dilatation.  I do see a large amount of stool in the right hemicolon, unfortunately this  means that this will not be amenable to a disimpaction or enema in the ED so patient was given lactulose , advised on drink plenty of fluids, ambulating and following up with PCP and/or GI.  She is informed of her incidental adrenal nodule that will require 1 year follow-up.  She was given strict return precautions. I ordered medication including fentanyl  for pain Reevaluation of the patient after these medicines showed that the patient improved I have reviewed the patients home medicines and have made adjustments as needed    Test / Admission - Considered:  No indication for admission at this time.  Amount and/or Complexity of Data Reviewed Labs: ordered. Radiology: ordered.  Risk Prescription drug management.        Final diagnoses:  None    ED Discharge Orders     None          Suellen Sherran DELENA DEVONNA 12/11/24 CATHLYN Dean Clarity, MD 12/20/24 1537  "

## 2024-12-11 NOTE — Discharge Instructions (Addendum)
 You are seen in the ER today for abdominal pain and constipation after recent gallbladder removal.  Your workup was overall reassuring.  You do appear to have a lot of stool in your colon, but unfortunately it looks like most of it is on the right side which is not amenable to an enema.  Prescribing lactulose  to help with the constipation.  I prescribed 15 mL but you can take up to 30 mL for the first several days, and may take a day or 2 to start having regular bowel movements.  Getting up and walking can help regulate bowel movements, as can drinking plenty of water .  He had an incidental adrenal nodule that we will need repeat imaging in 1 year.

## 2024-12-13 ENCOUNTER — Ambulatory Visit (INDEPENDENT_AMBULATORY_CARE_PROVIDER_SITE_OTHER): Admitting: Gastroenterology

## 2024-12-13 ENCOUNTER — Encounter (INDEPENDENT_AMBULATORY_CARE_PROVIDER_SITE_OTHER): Payer: Self-pay | Admitting: Gastroenterology

## 2024-12-19 NOTE — ED Provider Notes (Signed)
 Emergency Department Provider Note  T ED Assessment/Plan    History   Chief Complaint  Patient presents with   Skin Problem   Insect Bite   his 68 year old female with a history of coronary artery disease and previous MI hypertension hyperlipidemia, states that around noon yesterday she felt a sting on her right arm.  She did not see an insect but felt this was an insect bite.  It appeared over the next 12 to 18 hours she began to notice more similar pruritic lesions on the same arm and blister formation on the thumb, similar pruritic lesions on the neck.  No difficulty breathing or swallowing    Past Medical History[1]  Past Surgical History[2]  Family History[3]  Social History[4]  Review of Systems  Constitutional:  Negative for fever.  Respiratory:  Negative for cough.   Cardiovascular:  Negative for chest pain.  Gastrointestinal:  Negative for nausea and vomiting.  Neurological:  Negative for syncope and speech difficulty.    Physical Exam   BP 146/83   Pulse 78   Temp 36.8 C (98.2 F) (Temporal)   Resp 16   Ht 152.4 cm (5')   Wt 62.6 kg (138 lb)   SpO2 98%   BMI 26.95 kg/m   Physical Exam Constitutional:      General: She is in acute distress (Mild).     Appearance: Normal appearance.  HENT:     Head: Normocephalic and atraumatic.     Mouth/Throat:     Mouth: Mucous membranes are moist.     Pharynx: No posterior oropharyngeal erythema.     Comments: Airway patent Cardiovascular:     Rate and Rhythm: Normal rate and regular rhythm.  Pulmonary:     Effort: Pulmonary effort is normal.     Breath sounds: Normal breath sounds.  Skin:    General: Skin is warm and dry.     Comments: Multiple circular and linear areas of erythema and edema, sharply demarcated on the right forearm and neck.  1 cm area of ecchymoses and blister formation left thumb.  Neurological:     General: No focal deficit present.     Mental Status: She is alert and oriented to  person, place, and time.     ED Course       Medical Decision Making The patient presents with a spreading pruritic rash; concern for allergic reaction to medication or in 6,\; less likely viral exanthem, bacteremia manifestation.  The patient will be treated with IM and p.o. steroids and asked to follow-up.  Risk OTC drugs. Prescription drug management.                [1] Past Medical History: Diagnosis Date   Coronary artery disease    Hyperlipidemia    Hypertension    Myocardial infarction    (CMS-HCC)   [2] Past Surgical History: Procedure Laterality Date   KNEE ARTHROSCOPY Left   [3] No family history on file. [4] Social History Socioeconomic History   Marital status: Widowed  Tobacco Use   Smoking status: Every Day    Current packs/day: 2.00    Average packs/day: 2.0 packs/day for 31.2 years (62.4 ttl pk-yrs)    Types: Cigarettes    Start date: 10/06/1993  Substance and Sexual Activity   Alcohol use: Never   Drug use: Never   Social Drivers of Health   Tobacco Use: High Risk (12/11/2024)   Received from [facility]   Patient History  Smoking Tobacco Use: Every Day    Smokeless Tobacco Use: Never   Rosamond Lyndy Beagle, MD 12/19/24 1103

## 2024-12-21 ENCOUNTER — Ambulatory Visit (INDEPENDENT_AMBULATORY_CARE_PROVIDER_SITE_OTHER): Admitting: Surgery

## 2024-12-21 DIAGNOSIS — Z09 Encounter for follow-up examination after completed treatment for conditions other than malignant neoplasm: Secondary | ICD-10-CM

## 2024-12-21 NOTE — Progress Notes (Signed)
 [facility]  I am calling the patient for post operative evaluation. This is not a billable encounter as it is under the global charges for the surgery.  The patient had a robotic assisted laparoscopic cholecystectomy on 12/17. The patient reports that she is doing well.  She is tolerating a diet, having good pain control, and having regular Bms.  The incisions are healing well.  She initially had some constipation postoperatively for which she was seen in the emergency department on 12/20.  She was given a prescription for lactulose  and advised to follow-up with GI/her primary care doctor.  She states that she has had thinner stools since the surgery.  I advised that she should continue with a bowel regimen, and if her stools remain thin, she should follow-up with GI to discuss colonoscopy.  All questions were answered to her expressed satisfaction.  Pathology: A. GALLBLADDER, CHOLECYSTECTOMY:  Chronic cholecystitis.   Will see the patient PRN.   [name], DO [facility] [address] [place], [place] [address] [phone] (office)

## 2025-01-03 NOTE — Progress Notes (Unsigned)
 "  New Patient Note  RE: Kristy Hall MRN: [id number] DOB: 1956-01-29 Date of Office Visit: 01/04/2025  Consult requested by: Maree Isles, MD Primary care provider: Maree Isles, MD  Chief Complaint: No chief complaint on file.  History of Present Illness: I had the pleasure of seeing Kristy Hall for initial evaluation at the [facility] on 01/04/2025. She is a 69 y.o. female, who is referred here by Maree Isles, MD for the evaluation of rash.  Discussed the use of AI scribe software for clinical note transcription with the patient, who gave verbal consent to proceed.  History of Present Illness         Rash started about *** ago. Mainly occurs on her ***. Describes them as ***. Individual rashes lasts about ***. No ecchymosis upon resolution. Associated symptoms include: ***.  Frequency of episodes: ***. Suspected triggers are ***. Denies any *** fevers, chills, changes in medications, foods, personal care products or recent infections. She has tried the following therapies: *** with *** benefit. Systemic steroids ***. Currently on ***.  Previous work up includes: ***. Previous history of rash/hives: ***. Patient is up to date with the following cancer screening tests: ***.    12/19/2024 ER visit: his 69 year old female with a history of coronary artery disease and previous MI hypertension hyperlipidemia, states that around noon yesterday she felt a sting on her right arm. She did not see an insect but felt this was an insect bite. It appeared over the next 12 to 18 hours she began to notice more similar pruritic lesions on the same arm and blister formation on the thumb, similar pruritic lesions on the neck. No difficulty breathing or swallowing  Assessment and Plan: Kristy Hall is a 69 y.o. female with: ***  Assessment and Plan               No follow-ups on file.  No orders of the defined types were placed in this encounter.  Lab Orders  No  laboratory test(s) ordered today    Other allergy  screening: Asthma: {Blank single:19197::yes,no} Rhino conjunctivitis: {Blank single:19197::yes,no} Food allergy : {Blank single:19197::yes,no} Medication allergy : {Blank single:19197::yes,no} Hymenoptera allergy : {Blank single:19197::yes,no} Urticaria: {Blank single:19197::yes,no} Eczema:{Blank single:19197::yes,no} History of recurrent infections suggestive of immunodeficency: {Blank single:19197::yes,no}  Diagnostics: Spirometry:  Tracings reviewed. Her effort: {Blank single:19197::Good reproducible efforts.,It was hard to get consistent efforts and there is a question as to whether this reflects a maximal maneuver.,Poor effort, data can not be interpreted.} FVC: ***L FEV1: ***L, ***% predicted FEV1/FVC ratio: ***% Interpretation: {Blank single:19197::Spirometry consistent with mild obstructive disease,Spirometry consistent with moderate obstructive disease,Spirometry consistent with severe obstructive disease,Spirometry consistent with possible restrictive disease,Spirometry consistent with mixed obstructive and restrictive disease,Spirometry uninterpretable due to technique,Spirometry consistent with normal pattern,No overt abnormalities noted given today's efforts}.  Please see scanned spirometry results for details.  Skin Testing: {Blank single:19197::Select foods,Environmental allergy  panel,Environmental allergy  panel and select foods,Food allergy  panel,None,Deferred due to recent antihistamines use}. *** Results discussed with patient/family.   Past Medical History: Patient Active Problem List   Diagnosis Date Noted   Biliary dyskinesia 12/08/2024   Intra-abdominal adhesions 12/08/2024   Gastritis and gastroduodenitis 09/27/2024   Schatzki ring of distal esophagus 09/27/2024   Esophageal dysphagia 08/13/2024   Epigastric pain 08/13/2024   Chronic idiopathic  constipation 08/13/2024   Primary hypertension 12/26/2021   Coronary artery disease of native artery of native heart with stable angina pectoris 12/26/2021   Unstable angina (HCC) 11/05/2019   Mixed hyperlipidemia 11/05/2019  Tobacco use disorder 11/05/2019   Unstable angina pectoris (HCC) 11/05/2019   Past Medical History:  Diagnosis Date   Arthritis    Hypertension    Mixed hyperlipidemia 11/05/2019   Myocardial infarction (HCC)    Narcolepsy    SCCA (squamous cell carcinoma) of skin 10/18/2020   Right Lower Leg-Anterior (well diff)    SCCA (squamous cell carcinoma) of skin 05/29/2022   Mid Supratip of Nose (in situ) (tx p bx)   Tobacco use disorder 11/05/2019   Past Surgical History: Past Surgical History:  Procedure Laterality Date   ABDOMINAL HYSTERECTOMY     pt states her Hysterectomy was vaginal   BLADDER SUSPENSION     CARPAL TUNNEL RELEASE Bilateral    COLONOSCOPY     CORONARY STENT INTERVENTION N/A 11/05/2019   Procedure: CORONARY STENT INTERVENTION;  Surgeon: Ladona Heinz, MD;  Location: [facility] INVASIVE CV LAB;  Service: Cardiovascular;  Laterality: N/A;   ESOPHAGEAL DILATION N/A 09/27/2024   Procedure: DILATION, ESOPHAGUS;  Surgeon: Cinderella Deatrice FALCON, MD;  Location: [facility] ENDO SUITE;  Service: Endoscopy;  Laterality: N/A;   ESOPHAGOGASTRODUODENOSCOPY N/A 09/27/2024   Procedure: EGD (ESOPHAGOGASTRODUODENOSCOPY);  Surgeon: Cinderella Deatrice FALCON, MD;  Location: [facility] ENDO SUITE;  Service: Endoscopy;  Laterality: N/A;  9:00am, ASA 1-2   HEMORROIDECTOMY     LEFT HEART CATH AND CORONARY ANGIOGRAPHY N/A 11/05/2019   Procedure: LEFT HEART CATH AND CORONARY ANGIOGRAPHY;  Surgeon: Ladona Heinz, MD;  Location: [facility] INVASIVE CV LAB;  Service: Cardiovascular;  Laterality: N/A;   ROBOTIC ASSISTED LAPAROSCOPIC LYSIS OF ADHESION N/A 12/08/2024   Procedure: LYSIS, ADHESIONS, ROBOT-ASSISTED, LAPAROSCOPIC;  Surgeon: Evonnie Dorothyann LABOR, DO;  Location: [facility] ORS;  Service: General;  Laterality: N/A;    TUBAL LIGATION     Medication List:  Current Outpatient Medications  Medication Sig Dispense Refill   Ascorbic Acid (VITAMIN C PO) Take 1 tablet by mouth daily.     aspirin  (ASPIRIN  CHILDRENS) 81 MG chewable tablet Chew 1 tablet (81 mg total) by mouth daily.     atorvastatin  (LIPITOR) 80 MG tablet TAKE 1 TABLET BY MOUTH ONCE  DAILY AT 6 PM 100 tablet 2   docusate sodium (COLACE) 100 MG capsule Take 1 capsule (100 mg total) by mouth 2 (two) times daily. 60 capsule 2   lactulose  (CHRONULAC ) 10 GM/15ML solution Take 15 mLs (10 g total) by mouth daily as needed for mild constipation. 236 mL 0   losartan  (COZAAR ) 100 MG tablet TAKE 1 TABLET BY MOUTH DAILY  AFTER SUPPER 90 tablet 3   losartan  (COZAAR ) 50 MG tablet Take 50 mg by mouth daily.     metoprolol  succinate (TOPROL -XL) 25 MG 24 hr tablet Take 25 mg by mouth daily.     metoprolol  succinate (TOPROL -XL) 50 MG 24 hr tablet Take 1 tablet (50 mg total) by mouth daily. (Patient not taking: Reported on 12/01/2024) 90 tablet 3   naproxen sodium (ALEVE) 220 MG tablet Take 220 mg by mouth 2 (two) times daily as needed (pain).     ondansetron  (ZOFRAN ) 4 MG tablet Take 1 tablet (4 mg total) by mouth daily as needed for nausea or vomiting. 30 tablet 1   oxyCODONE  (ROXICODONE ) 5 MG immediate release tablet Take 1 tablet (5 mg total) by mouth every 6 (six) hours as needed. 8 tablet 0   pantoprazole (PROTONIX) 40 MG tablet Take 40 mg by mouth daily.     No current facility-administered medications for this visit.   Allergies: Allergies[1] Social History: Social History  Socioeconomic History   Marital status: Widowed    Spouse name: Not on file   Number of children: 3   Years of education: Not on file   Highest education level: Not on file  Occupational History   Not on file  Tobacco Use   Smoking status: Every Day    Current packs/day: 0.00    Average packs/day: 0.5 packs/day for 15.0 years (7.5 ttl pk-yrs)    Types: Cigarettes    Start  date: 04/2005    Last attempt to quit: 04/2020    Years since quitting: 4.7   Smokeless tobacco: Never  Vaping Use   Vaping status: Former  Substance and Sexual Activity   Alcohol use: Not Currently    Comment: occasional   Drug use: No   Sexual activity: Not on file  Other Topics Concern   Not on file  Social History Narrative   Not on file   Social Drivers of Health   Tobacco Use: High Risk (12/11/2024)   Patient History    Smoking Tobacco Use: Every Day    Smokeless Tobacco Use: Never    Passive Exposure: Not on file  Financial Resource Strain: Not on file  Food Insecurity: Not on file  Transportation Needs: Not on file  Physical Activity: Not on file  Stress: Not on file  Social Connections: Not on file  Depression (EYV7-0): Not on file  Alcohol Screen: Not on file  Housing: Not on file  Utilities: Not on file  Health Literacy: Not on file   Lives in a ***. Smoking: *** Occupation: ***  Environmental History: Water  Damage/mildew in the house: {Blank single:19197::yes,no} Carpet in the family room: {Blank single:19197::yes,no} Carpet in the bedroom: {Blank single:19197::yes,no} Heating: {Blank single:19197::electric,gas,heat pump} Cooling: {Blank single:19197::central,window,heat pump} Pet: {Blank single:19197::yes ***,no}  Family History: Family History  Problem Relation Age of Onset   COPD Mother    Basal cell carcinoma Mother    COPD Brother    Breast cancer Neg Hx    Problem                               Relation Asthma                                   *** Eczema                                *** Food allergy                           *** Allergic rhino conjunctivitis     ***  Review of Systems  Constitutional:  Negative for appetite change, chills, fever and unexpected weight change.  HENT:  Negative for congestion and rhinorrhea.   Eyes:  Negative for itching.  Respiratory:  Negative for cough, chest tightness,  shortness of breath and wheezing.   Cardiovascular:  Negative for chest pain.  Gastrointestinal:  Negative for abdominal pain.  Genitourinary:  Negative for difficulty urinating.  Skin:  Negative for rash.  Neurological:  Negative for headaches.    Objective: There were no vitals taken for this visit. There is no height or weight on file to calculate BMI. Physical Exam Vitals and nursing note reviewed.  Constitutional:      Appearance:  Normal appearance. She is well-developed.  HENT:     Head: Normocephalic and atraumatic.     Right Ear: Tympanic membrane and external ear normal.     Left Ear: Tympanic membrane and external ear normal.     Nose: Nose normal.     Mouth/Throat:     Mouth: Mucous membranes are moist.     Pharynx: Oropharynx is clear.  Eyes:     Conjunctiva/sclera: Conjunctivae normal.  Cardiovascular:     Rate and Rhythm: Normal rate and regular rhythm.     Heart sounds: Normal heart sounds. No murmur heard.    No friction rub. No gallop.  Pulmonary:     Effort: Pulmonary effort is normal.     Breath sounds: Normal breath sounds. No wheezing, rhonchi or rales.  Musculoskeletal:     Cervical back: Neck supple.  Skin:    General: Skin is warm.     Findings: No rash.  Neurological:     Mental Status: She is alert and oriented to person, place, and time.  Psychiatric:        Behavior: Behavior normal.    The plan was reviewed with the patient/family, and all questions/concerned were addressed.  It was my pleasure to see Kristy Hall today and participate in her care. Please feel free to contact me with any questions or concerns.  Sincerely,  Orlan Cramp, DO Allergy  & Immunology  [facility]  [place] office: [phone] [facility] office: [phone]    [1] No Known Allergies  "

## 2025-01-04 ENCOUNTER — Ambulatory Visit: Admitting: Allergy

## 2025-01-04 ENCOUNTER — Other Ambulatory Visit: Payer: Self-pay

## 2025-01-04 ENCOUNTER — Encounter: Payer: Self-pay | Admitting: Allergy

## 2025-01-04 VITALS — BP 136/72 | HR 77 | Temp 97.8°F | Resp 18 | Ht 60.25 in | Wt 135.0 lb

## 2025-01-04 DIAGNOSIS — L272 Dermatitis due to ingested food: Secondary | ICD-10-CM

## 2025-01-04 DIAGNOSIS — L509 Urticaria, unspecified: Secondary | ICD-10-CM

## 2025-01-04 DIAGNOSIS — L299 Pruritus, unspecified: Secondary | ICD-10-CM | POA: Diagnosis not present

## 2025-01-04 MED ORDER — CETIRIZINE HCL 10 MG PO TABS: 10.0000 mg | ORAL_TABLET | Freq: Two times a day (BID) | ORAL | 2 refills | Status: AC

## 2025-01-04 MED ORDER — FAMOTIDINE 20 MG PO TABS: 20.0000 mg | ORAL_TABLET | Freq: Two times a day (BID) | ORAL | 2 refills | Status: AC

## 2025-01-04 NOTE — Patient Instructions (Addendum)
 Skin  Keep track of rashes and take pictures. Write down what you had done/eaten during flares.  See below for proper skin care. Use fragrance free and dye free products. No dryer sheets or fabric softener.    Finished prednisone  as prescribed.  Avoid mammalian meat for now (no beef, pork, lamb). Checking for alpha-gal.  Start zyrtec  (cetirizine ) 10mg  twice a day. If symptoms are not controlled or causes drowsiness let us  know. Start Pepcid  (famotidine ) 20mg  twice a day.  Avoid the following potential triggers: alcohol, tight clothing, NSAIDs, hot showers and getting overheated. See below for proper skin care.   Get bloodwork. We are ordering labs, so please allow 1-2 weeks for the results to come back. With the newly implemented Cures Act, the labs might be visible to you at the same time that they become visible to me. However, I will not address the results until all of the results are back, so please be patient.   Return in about 3 weeks (around 01/25/2025). Or sooner if needed.  Follow up with PCP regarding your concerns about the abnormal CT.   Skin care recommendations  Bath time: Always use lukewarm water . AVOID very hot or cold water . Keep bathing time to 5-10 minutes. Do NOT use bubble bath. Use a mild soap and use just enough to wash the dirty areas. Do NOT scrub skin vigorously.  After bathing, pat dry your skin with a towel. Do NOT rub or scrub the skin.  Moisturizers and prescriptions:  ALWAYS apply moisturizers immediately after bathing (within 3 minutes). This helps to lock-in moisture. Use the moisturizer several times a day over the whole body. Good summer moisturizers include: Aveeno, CeraVe, Cetaphil. Good winter moisturizers include: Aquaphor, Vaseline, Cerave, Cetaphil, Eucerin, Vanicream. When using moisturizers along with medications, the moisturizer should be applied about one hour after applying the medication to prevent diluting effect of the medication  or moisturize around where you applied the medications. When not using medications, the moisturizer can be continued twice daily as maintenance.  Laundry and clothing: Avoid laundry products with added color or perfumes. Use unscented hypo-allergenic laundry products such as Tide free, Cheer free & gentle, and All free and clear.  If the skin still seems dry or sensitive, you can try double-rinsing the clothes. Avoid tight or scratchy clothing such as wool. Do not use fabric softeners or dyer sheets.

## 2025-01-07 ENCOUNTER — Ambulatory Visit: Payer: Self-pay | Admitting: Allergy

## 2025-01-07 LAB — PROTEIN ELECTROPHORESIS, SERUM
A/G Ratio: 1.3 (ref 0.7–1.7)
Albumin ELP: 4 g/dL (ref 2.9–4.4)
Alpha 1: 0.3 g/dL (ref 0.0–0.4)
Alpha 2: 1.1 g/dL — ABNORMAL HIGH (ref 0.4–1.0)
Beta: 1.2 g/dL (ref 0.7–1.3)
Gamma Globulin: 0.6 g/dL (ref 0.4–1.8)
Globulin, Total: 3.1 g/dL (ref 2.2–3.9)

## 2025-01-07 LAB — PROTEIN ELECTROPHORESIS, URINE REFLEX
Albumin ELP, Urine: 100 %
Alpha-1-Globulin, U: 0 %
Alpha-2-Globulin, U: 0 %
Beta Globulin, U: 0 %
Gamma Globulin, U: 0 %
Protein, Ur: 4.7 mg/dL

## 2025-01-07 LAB — ALLERGY PROFILE, FOOD
Allergen Salmon IgE: <0.1 kU/L
Codfish IgE: <0.1 kU/L
F001-IgE Egg White: <0.1 kU/L
F002-IgE Milk: <0.1 kU/L
F004-IgE Wheat: <0.1 kU/L
F010-IgE Sesame Seed: <0.1 kU/L
F017-IgE Hazelnut (Filbert): <0.1 kU/L
F018-IgE Brazil Nut: <0.1 kU/L
F020-IgE Almond: <0.1 kU/L
F202-IgE Cashew Nut: <0.1 kU/L
F256-IgE Walnut: <0.1 kU/L
F416-IgE Tri a 19(w-5 gliadin): <0.1 kU/L
Peanut, IgE: <0.1 kU/L
Scallop IgE: <0.1 kU/L
Shrimp IgE: <0.1 kU/L
Soybean IgE: <0.1 kU/L
Tuna: <0.1 kU/L

## 2025-01-07 LAB — CHRONIC URTICARIA (CU) EVAL
ALT: 21 IU/L (ref 0–32)
Basophils Absolute: 0 x10E3/uL (ref 0.0–0.2)
Basos: 0 %
CRP: <1 mg/L (ref 0–10)
EOS (ABSOLUTE): 0 x10E3/uL (ref 0.0–0.4)
Eos: 0 %
Hematocrit: 45.4 % (ref 34.0–46.6)
Hemoglobin: 15 g/dL (ref 11.1–15.9)
Immature Grans (Abs): 0 x10E3/uL (ref 0.0–0.1)
Immature Granulocytes: 0 %
Lymphocytes Absolute: 1.2 x10E3/uL (ref 0.7–3.1)
Lymphs: 11 %
MCH: 30.4 pg (ref 26.6–33.0)
MCHC: 33 g/dL (ref 31.5–35.7)
MCV: 92 fL (ref 79–97)
Monocytes Absolute: 0.2 x10E3/uL (ref 0.1–0.9)
Monocytes: 2 %
Neutrophils Absolute: 9.5 x10E3/uL — ABNORMAL HIGH (ref 1.4–7.0)
Neutrophils: 87 %
Platelets: 272 x10E3/uL (ref 150–450)
Pooled Donor- BAT CU: 7.54 % (ref 0.00–10.60)
RBC: 4.94 x10E6/uL (ref 3.77–5.28)
RDW: 12.1 % (ref 11.7–15.4)
Sed Rate: 3 mm/h (ref 0–40)
Thyroperoxidase Ab SerPl-aCnc: 15 [IU]/mL (ref 0–34)
WBC: 10.9 x10E3/uL — ABNORMAL HIGH (ref 3.4–10.8)

## 2025-01-07 LAB — ANTINUCLEAR ANTIBODIES, IFA: ANA Titer 1: NEGATIVE

## 2025-01-07 LAB — COMPREHENSIVE METABOLIC PANEL WITH GFR
AST: 15 IU/L (ref 0–40)
Albumin: 4.8 g/dL (ref 3.9–4.9)
Alkaline Phosphatase: 128 IU/L (ref 49–135)
BUN/Creatinine Ratio: 9 — ABNORMAL LOW (ref 12–28)
BUN: 6 mg/dL — ABNORMAL LOW (ref 8–27)
Bilirubin Total: 0.9 mg/dL (ref 0.0–1.2)
CO2: 25 mmol/L (ref 20–29)
Calcium: 10.5 mg/dL — ABNORMAL HIGH (ref 8.7–10.3)
Chloride: 100 mmol/L (ref 96–106)
Creatinine, Ser: 0.65 mg/dL (ref 0.57–1.00)
Globulin, Total: 2.3 g/dL (ref 1.5–4.5)
Glucose: 152 mg/dL — ABNORMAL HIGH (ref 70–99)
Potassium: 4.3 mmol/L (ref 3.5–5.2)
Sodium: 140 mmol/L (ref 134–144)
Total Protein: 7.1 g/dL (ref 6.0–8.5)
eGFR: 95 mL/min/1.73

## 2025-01-07 LAB — ALPHA-GAL PANEL
Allergen Lamb IgE: 0.17 kU/L — AB
Beef IgE: 0.44 kU/L — AB
IgE (Immunoglobulin E), Serum: 63 [IU]/mL (ref 6–495)
O215-IgE Alpha-Gal: 1.67 kU/L — AB
Pork IgE: 0.15 kU/L — AB

## 2025-01-07 LAB — TRYPTASE: Tryptase: 5.3 ug/L (ref 2.2–13.2)

## 2025-01-07 LAB — THYROID CASCADE PROFILE: TSH: 0.553 u[IU]/mL (ref 0.450–4.500)

## 2025-01-28 ENCOUNTER — Encounter: Payer: Self-pay | Admitting: Allergy & Immunology

## 2025-01-28 ENCOUNTER — Ambulatory Visit: Admitting: Allergy & Immunology

## 2025-01-28 VITALS — BP 122/74 | HR 72 | Temp 97.8°F | Resp 12

## 2025-01-28 DIAGNOSIS — L272 Dermatitis due to ingested food: Secondary | ICD-10-CM

## 2025-01-28 DIAGNOSIS — L299 Pruritus, unspecified: Secondary | ICD-10-CM

## 2025-01-28 DIAGNOSIS — L509 Urticaria, unspecified: Secondary | ICD-10-CM

## 2025-01-28 MED ORDER — EPINEPHRINE 0.3 MG/0.3ML IJ SOAJ: 0.3000 mg | INTRAMUSCULAR | 1 refills | Status: AC | PRN

## 2025-01-28 NOTE — Progress Notes (Signed)
 "  FOLLOW UP  Date of Service/Encounter:  01/28/25   Assessment:   Urticaria  Pruritus  Dermatitis due to ingested food  Plan/Recommendations:   1. Urticaria and pruritus - with confirmed alpha gal syndrome - I would strongly consider doing Xolair to help manage your symptoms. - This is an injectable medication that neutralizes your IgE (allergy  antibodies) to help control your hives.  - Consider doing this to allow you to get milk back into your diet. - It won't allow you to eat a steak, but it should increase your diet in that you can introduce into your diet.  - Consent signed for Xolair today.  - I would continue with the suppressive antihistamine dosing like you are doing:  AM: Allegra 180mg  + Pepcid  20mg    PM: Allegra 180mg  + Pepcid  20mg    2. Return in about 3 months (around 04/27/2025). You can have the follow up appointment with Dr. Iva or a Nurse Practicioner (our Nurse Practitioners are excellent and always have Physician oversight!).   Subjective:   Kristy Hall is a 69 y.o. female presenting today for follow up of  Chief Complaint  Patient presents with   Follow-up    Kristy Hall has a history of the following: Patient Active Problem List   Diagnosis Date Noted   Biliary dyskinesia 12/08/2024   Intra-abdominal adhesions 12/08/2024   Gastritis and gastroduodenitis 09/27/2024   Schatzki ring of distal esophagus 09/27/2024   Esophageal dysphagia 08/13/2024   Epigastric pain 08/13/2024   Chronic idiopathic constipation 08/13/2024   Primary hypertension 12/26/2021   Coronary artery disease of native artery of native heart with stable angina pectoris 12/26/2021   Unstable angina (HCC) 11/05/2019   Mixed hyperlipidemia 11/05/2019   Tobacco use disorder 11/05/2019   Unstable angina pectoris (HCC) 11/05/2019    History obtained from: chart review and patient.  Discussed the use of AI scribe software for clinical note transcription with the patient  and/or guardian, who gave verbal consent to proceed.  Kristy Hall is a 69 y.o. female presenting for a follow up visit.  She was last seen in January 2026 by Dr. Luke.  At that time, she was evaluated for urticaria and itching.  She had labs performed.  She was started on Zyrtec  10 mg twice a day and Pepcid  20 mg twice a day.  Labs showed a positive alpha gal.  Continued avoidance of red meat was recommended.  CBC, metabolic panel, thyroid , ANA and inflammation markers, tryptase, and urine test were all normal.  Since last visit, she has not done very well.   She experiences allergic reactions, described as 'knots' or hives, after consuming certain foods. She has been avoiding red meat and milk products, suspecting them as triggers. Despite these dietary changes, symptoms persist, particularly after consuming products containing whey, such as Tostitos with a hint of lime.  She has been keeping a food diary to track her reactions and has identified milk as a significant trigger. An incident occurred where she accidentally consumed regular milk instead of almond milk, resulting in a typical allergic reaction.  She has been using antihistamines like Xyzal and Pepcid , initially only during episodes. Since Wednesday, she has started taking them regularly. She switched to Allegra due to cost concerns, purchasing it over-the-counter instead of through a prescription. She has not heard about Xolair but she is open to considering it. She is now afraid to eat any foods - especially if there is any milk in any of them.  She has not been prescribed an EpiPen  previously, despite experiencing significant swelling and hives. No known environmental allergies, as she denies symptoms like runny nose, sneezing, or itchy eyes.   Food Allergy  Symptom History: She has had alpha gal for years. She has been avoiding red meat for years. She had been eating Tostitos and munching away. She realized that they had whey protein after  she read the container of chips.    Otherwise, there have been no changes to her past medical history, surgical history, family history, or social history.    Review of systems otherwise negative other than that mentioned in the HPI.    Objective:   Blood pressure 122/74, pulse 72, temperature 97.8 F (36.6 C), resp. rate 12, SpO2 97%. There is no height or weight on file to calculate BMI.    Physical Exam Constitutional:      Appearance: She is well-developed.  HENT:     Head: Normocephalic and atraumatic.     Right Ear: Tympanic membrane, ear canal and external ear normal.     Left Ear: Tympanic membrane, ear canal and external ear normal.     Nose: No nasal deformity, septal deviation, mucosal edema or rhinorrhea.     Right Turbinates: Not enlarged or swollen.     Left Turbinates: Not enlarged or swollen.     Right Sinus: No maxillary sinus tenderness or frontal sinus tenderness.     Left Sinus: No maxillary sinus tenderness or frontal sinus tenderness.     Mouth/Throat:     Mouth: Mucous membranes are not pale and not dry.     Pharynx: Uvula midline.  Eyes:     General: Lids are normal. No allergic shiner.       Right eye: No discharge.        Left eye: No discharge.     Conjunctiva/sclera: Conjunctivae normal.     Right eye: Right conjunctiva is not injected. No chemosis.    Left eye: Left conjunctiva is not injected. No chemosis.    Pupils: Pupils are equal, round, and reactive to light.  Cardiovascular:     Rate and Rhythm: Normal rate and regular rhythm.     Heart sounds: Normal heart sounds.  Pulmonary:     Effort: Pulmonary effort is normal. No tachypnea, accessory muscle usage or respiratory distress.     Breath sounds: Normal breath sounds. No wheezing, rhonchi or rales.  Chest:     Chest wall: No tenderness.  Lymphadenopathy:     Cervical: No cervical adenopathy.  Skin:    General: Skin is warm.     Capillary Refill: Capillary refill takes less than 2  seconds.     Coloration: Skin is not pale.     Findings: No abrasion, erythema, petechiae or rash. Rash is not papular, urticarial or vesicular.     Comments: She does have some urticaria and angioedema on her bilateral arms.   Neurological:     Mental Status: She is alert.  Psychiatric:        Behavior: Behavior is cooperative.      Diagnostic studies: none       Marty Shaggy, MD  [facility]        "

## 2025-01-28 NOTE — Patient Instructions (Addendum)
 1. Urticaria and pruritus - with confirmed alpha gal syndrome - I would strongly consider doing Xolair to help manage your symptoms. - This is an injectable medication that neutralizes your IgE (allergy  antibodies) to help control your hives.  - Consider doing this to allow you to get milk back into your diet. - It won't allow you to eat a steak, but it should increase your diet in that you can introduce into your diet.  - Consent signed for Xolair today.  - I would continue with the suppressive antihistamine dosing like you are doing:  AM: Allegra 180mg  + Pepcid  20mg    PM: Allegra 180mg  + Pepcid  20mg      2. Return in about 3 months (around 04/27/2025). You can have the follow up appointment with Dr. Iva or a Nurse Practicioner (our Nurse Practitioners are excellent and always have Physician oversight!).    Please inform us  of any Emergency Department visits, hospitalizations, or changes in symptoms. Call us  before going to the ED for breathing or allergy  symptoms since we might be able to fit you in for a sick visit. Feel free to contact us  anytime with any questions, problems, or concerns.  It was a pleasure to meet you today!  Websites that have reliable patient information: 1. American Academy of Asthma, Allergy , and Immunology: www.aaaai.org 2. Food Allergy  Research and Education (FARE): foodallergy.org 3. Mothers of Asthmatics: http://www.asthmacommunitynetwork.org 4. American College of Allergy , Asthma, and Immunology: www.acaai.org      Like us  on [employer] and Instagram for our latest updates!      A healthy democracy works best when [employer] participate! Make sure you are registered to vote! If you have moved or changed any of your contact information, you will need to get this updated before voting! Scan the QR codes below to learn more!

## 2025-04-29 ENCOUNTER — Ambulatory Visit: Admitting: Family Medicine
